# Patient Record
Sex: Female | Born: 1945 | Race: White | Hispanic: No | Marital: Married | Smoking: Never smoker
Health system: Southern US, Community
[De-identification: ages and names within clinical notes are randomized; demographics above are authoritative.]

## PROBLEM LIST (undated history)

## (undated) DIAGNOSIS — R609 Edema, unspecified: Secondary | ICD-10-CM

## (undated) DIAGNOSIS — M1990 Unspecified osteoarthritis, unspecified site: Secondary | ICD-10-CM

## (undated) DIAGNOSIS — M2550 Pain in unspecified joint: Secondary | ICD-10-CM

## (undated) DIAGNOSIS — R55 Syncope and collapse: Secondary | ICD-10-CM

## (undated) DIAGNOSIS — E0520 Thyrotoxicosis with toxic multinodular goiter without thyrotoxic crisis or storm: Secondary | ICD-10-CM

## (undated) DIAGNOSIS — F32A Depression, unspecified: Secondary | ICD-10-CM

## (undated) DIAGNOSIS — I219 Acute myocardial infarction, unspecified: Secondary | ICD-10-CM

## (undated) DIAGNOSIS — I480 Paroxysmal atrial fibrillation: Secondary | ICD-10-CM

## (undated) DIAGNOSIS — I10 Essential (primary) hypertension: Secondary | ICD-10-CM

## (undated) DIAGNOSIS — R519 Headache, unspecified: Secondary | ICD-10-CM

## (undated) DIAGNOSIS — D259 Leiomyoma of uterus, unspecified: Secondary | ICD-10-CM

## (undated) DIAGNOSIS — IMO0001 Reserved for inherently not codable concepts without codable children: Secondary | ICD-10-CM

## (undated) DIAGNOSIS — R32 Unspecified urinary incontinence: Secondary | ICD-10-CM

## (undated) DIAGNOSIS — M3390 Dermatopolymyositis, unspecified, organ involvement unspecified: Secondary | ICD-10-CM

## (undated) DIAGNOSIS — R7989 Other specified abnormal findings of blood chemistry: Secondary | ICD-10-CM

## (undated) DIAGNOSIS — I2510 Atherosclerotic heart disease of native coronary artery without angina pectoris: Secondary | ICD-10-CM

## (undated) DIAGNOSIS — E538 Deficiency of other specified B group vitamins: Secondary | ICD-10-CM

## (undated) DIAGNOSIS — I499 Cardiac arrhythmia, unspecified: Secondary | ICD-10-CM

## (undated) DIAGNOSIS — R51 Headache: Secondary | ICD-10-CM

## (undated) DIAGNOSIS — E039 Hypothyroidism, unspecified: Secondary | ICD-10-CM

## (undated) DIAGNOSIS — E119 Type 2 diabetes mellitus without complications: Secondary | ICD-10-CM

## (undated) DIAGNOSIS — M791 Myalgia, unspecified site: Secondary | ICD-10-CM

## (undated) DIAGNOSIS — E0590 Thyrotoxicosis, unspecified without thyrotoxic crisis or storm: Secondary | ICD-10-CM

## (undated) DIAGNOSIS — R600 Localized edema: Secondary | ICD-10-CM

## (undated) DIAGNOSIS — F419 Anxiety disorder, unspecified: Secondary | ICD-10-CM

## (undated) DIAGNOSIS — M3313 Other dermatomyositis without myopathy: Secondary | ICD-10-CM

## (undated) DIAGNOSIS — F329 Major depressive disorder, single episode, unspecified: Secondary | ICD-10-CM

## (undated) HISTORY — DX: Localized edema: R60.0

## (undated) HISTORY — DX: Unspecified urinary incontinence: R32

## (undated) HISTORY — DX: Morbid (severe) obesity due to excess calories: E66.01

## (undated) HISTORY — PX: ANKLE SURGERY: SHX546

## (undated) HISTORY — PX: TONSILLECTOMY: SUR1361

## (undated) HISTORY — DX: Paroxysmal atrial fibrillation: I48.0

## (undated) HISTORY — DX: Atherosclerotic heart disease of native coronary artery without angina pectoris: I25.10

## (undated) HISTORY — DX: Pain in unspecified joint: M25.50

## (undated) HISTORY — PX: KNEE SURGERY: SHX244

## (undated) HISTORY — DX: Thyrotoxicosis with toxic multinodular goiter without thyrotoxic crisis or storm: E05.20

## (undated) HISTORY — DX: Depression, unspecified: F32.A

## (undated) HISTORY — DX: Acute myocardial infarction, unspecified: I21.9

## (undated) HISTORY — DX: Thyrotoxicosis, unspecified without thyrotoxic crisis or storm: E05.90

## (undated) HISTORY — DX: Myalgia, unspecified site: M79.10

## (undated) HISTORY — DX: Other dermatomyositis without myopathy: M33.13

## (undated) HISTORY — DX: Essential (primary) hypertension: I10

## (undated) HISTORY — DX: Major depressive disorder, single episode, unspecified: F32.9

## (undated) HISTORY — DX: Leiomyoma of uterus, unspecified: D25.9

## (undated) HISTORY — DX: Dermatopolymyositis, unspecified, organ involvement unspecified: M33.90

## (undated) HISTORY — DX: Deficiency of other specified B group vitamins: E53.8

## (undated) HISTORY — PX: DILATION AND CURETTAGE OF UTERUS: SHX78

## (undated) HISTORY — DX: Other specified abnormal findings of blood chemistry: R79.89

---

## 2012-03-22 ENCOUNTER — Inpatient Hospital Stay: Payer: Self-pay | Admitting: Internal Medicine

## 2012-03-22 LAB — CBC
MCH: 26.5 pg (ref 26.0–34.0)
MCV: 82 fL (ref 80–100)
Platelet: 162 10*3/uL (ref 150–440)
RBC: 4.78 10*6/uL (ref 3.80–5.20)
RDW: 16 % — ABNORMAL HIGH (ref 11.5–14.5)
WBC: 9 10*3/uL (ref 3.6–11.0)

## 2012-03-22 LAB — URINALYSIS, COMPLETE
Leukocyte Esterase: NEGATIVE
Nitrite: NEGATIVE
Ph: 6 (ref 4.5–8.0)
Protein: NEGATIVE
RBC,UR: <1 /HPF (ref 0–5)
Specific Gravity: 1.009 (ref 1.003–1.030)
Squamous Epithelial: <1
WBC UR: 1 /HPF (ref 0–5)

## 2012-03-22 LAB — COMPREHENSIVE METABOLIC PANEL
Alkaline Phosphatase: 98 U/L (ref 50–136)
Bilirubin,Total: 1.4 mg/dL — ABNORMAL HIGH (ref 0.2–1.0)
Calcium, Total: 9.8 mg/dL (ref 8.5–10.1)
Creatinine: 0.59 mg/dL — ABNORMAL LOW (ref 0.60–1.30)
EGFR (Non-African Amer.): >60
Osmolality: 286 (ref 275–301)
Potassium: 3.4 mmol/L — ABNORMAL LOW (ref 3.5–5.1)
SGOT(AST): 21 U/L (ref 15–37)
SGPT (ALT): 19 U/L
Sodium: 141 mmol/L (ref 136–145)

## 2012-03-22 LAB — CK TOTAL AND CKMB (NOT AT ARMC)
CK, Total: 45 U/L (ref 21–215)
CK-MB: 2.1 ng/mL (ref 0.5–3.6)
CK-MB: 1.7 ng/mL (ref 0.5–3.6)

## 2012-03-22 LAB — TROPONIN I
Troponin-I: <0.02 ng/mL
Troponin-I: 0.02 ng/mL

## 2012-03-22 LAB — PRO B NATRIURETIC PEPTIDE: B-Type Natriuretic Peptide: 1081 pg/mL — ABNORMAL HIGH (ref 0–125)

## 2012-03-23 LAB — BASIC METABOLIC PANEL
Anion Gap: 9 (ref 7–16)
BUN: 16 mg/dL (ref 7–18)
Calcium, Total: 9.8 mg/dL (ref 8.5–10.1)
Chloride: 107 mmol/L (ref 98–107)
Co2: 25 mmol/L (ref 21–32)
Glucose: 233 mg/dL — ABNORMAL HIGH (ref 65–99)
Osmolality: 290 (ref 275–301)
Sodium: 141 mmol/L (ref 136–145)

## 2012-03-23 LAB — MAGNESIUM: Magnesium: 1.6 mg/dL — ABNORMAL LOW

## 2012-03-23 LAB — LIPID PANEL
Cholesterol: 110 mg/dL (ref 0–200)
HDL Cholesterol: 30 mg/dL — ABNORMAL LOW (ref 40–60)
Ldl Cholesterol, Calc: 69 mg/dL (ref 0–100)

## 2012-03-23 LAB — CBC WITH DIFFERENTIAL/PLATELET
Basophil #: 0 10*3/uL (ref 0.0–0.1)
Basophil %: 0.4 %
Eosinophil #: 0.2 10*3/uL (ref 0.0–0.7)
Eosinophil %: 1.6 %
HCT: 38.7 % (ref 35.0–47.0)
MCH: 26.6 pg (ref 26.0–34.0)
MCV: 83 fL (ref 80–100)
Monocyte #: 0.9 x10 3/mm (ref 0.2–0.9)
Monocyte %: 8 %
Neutrophil %: 70.3 %
Platelet: 173 10*3/uL (ref 150–440)
RBC: 4.68 10*6/uL (ref 3.80–5.20)
RDW: 16.1 % — ABNORMAL HIGH (ref 11.5–14.5)

## 2012-03-23 LAB — T4, FREE: Free Thyroxine: 1.52 ng/dL — ABNORMAL HIGH (ref 0.76–1.46)

## 2012-03-23 LAB — CK TOTAL AND CKMB (NOT AT ARMC)
CK, Total: 45 U/L (ref 21–215)
CK-MB: 2 ng/mL (ref 0.5–3.6)

## 2012-03-23 LAB — TROPONIN I: Troponin-I: 0.03 ng/mL

## 2012-03-23 LAB — TSH: Thyroid Stimulating Horm: 0.016 u[IU]/mL — ABNORMAL LOW

## 2012-04-24 ENCOUNTER — Emergency Department: Payer: Self-pay | Admitting: Emergency Medicine

## 2012-04-24 LAB — CBC
HGB: 13.7 g/dL (ref 12.0–16.0)
MCH: 27.1 pg (ref 26.0–34.0)
Platelet: 192 10*3/uL (ref 150–440)
RBC: 5.03 10*6/uL (ref 3.80–5.20)
WBC: 14 10*3/uL — ABNORMAL HIGH (ref 3.6–11.0)

## 2012-04-24 LAB — COMPREHENSIVE METABOLIC PANEL
Anion Gap: 7 (ref 7–16)
Bilirubin,Total: 1.5 mg/dL — ABNORMAL HIGH (ref 0.2–1.0)
Calcium, Total: 10.1 mg/dL (ref 8.5–10.1)
Chloride: 102 mmol/L (ref 98–107)
Creatinine: 0.74 mg/dL (ref 0.60–1.30)
EGFR (Non-African Amer.): >60
Glucose: 234 mg/dL — ABNORMAL HIGH (ref 65–99)
Osmolality: 281 (ref 275–301)
Potassium: 3.4 mmol/L — ABNORMAL LOW (ref 3.5–5.1)
SGOT(AST): 16 U/L (ref 15–37)
SGPT (ALT): 18 U/L
Sodium: 137 mmol/L (ref 136–145)
Total Protein: 7.6 g/dL (ref 6.4–8.2)

## 2012-04-24 LAB — SEDIMENTATION RATE: Erythrocyte Sed Rate: 5 mm/hr (ref 0–30)

## 2012-04-24 LAB — PROTIME-INR: Prothrombin Time: 15.5 secs — ABNORMAL HIGH (ref 11.5–14.7)

## 2012-05-22 ENCOUNTER — Inpatient Hospital Stay: Payer: Self-pay | Admitting: Internal Medicine

## 2012-05-22 LAB — PROTIME-INR
INR: 1.2
Prothrombin Time: 15.4 secs — ABNORMAL HIGH (ref 11.5–14.7)

## 2012-05-22 LAB — URINALYSIS, COMPLETE
Bacteria: NONE SEEN
Bilirubin,UR: NEGATIVE
Glucose,UR: NEGATIVE mg/dL (ref 0–75)
Leukocyte Esterase: NEGATIVE
Nitrite: NEGATIVE
Ph: 8 (ref 4.5–8.0)
Protein: NEGATIVE
RBC,UR: 1 /HPF (ref 0–5)
Specific Gravity: 1.004 (ref 1.003–1.030)
Squamous Epithelial: 1
WBC UR: <1 /HPF (ref 0–5)

## 2012-05-22 LAB — COMPREHENSIVE METABOLIC PANEL
Alkaline Phosphatase: 114 U/L (ref 50–136)
Anion Gap: 9 (ref 7–16)
BUN: 8 mg/dL (ref 7–18)
Bilirubin,Total: 0.9 mg/dL (ref 0.2–1.0)
Chloride: 106 mmol/L (ref 98–107)
Co2: 26 mmol/L (ref 21–32)
EGFR (Non-African Amer.): >60
Glucose: 132 mg/dL — ABNORMAL HIGH (ref 65–99)
Osmolality: 281 (ref 275–301)
Potassium: 3.5 mmol/L (ref 3.5–5.1)
SGOT(AST): 18 U/L (ref 15–37)
SGPT (ALT): 17 U/L
Total Protein: 6.7 g/dL (ref 6.4–8.2)

## 2012-05-22 LAB — TROPONIN I
Troponin-I: <0.02 ng/mL
Troponin-I: 1.2 ng/mL — ABNORMAL HIGH

## 2012-05-22 LAB — CBC
MCH: 27.2 pg (ref 26.0–34.0)
MCV: 83 fL (ref 80–100)
Platelet: 195 10*3/uL (ref 150–440)
RBC: 5.11 10*6/uL (ref 3.80–5.20)

## 2012-05-22 LAB — CK TOTAL AND CKMB (NOT AT ARMC)
CK, Total: 78 U/L (ref 21–215)
CK-MB: 0.9 ng/mL (ref 0.5–3.6)

## 2012-05-22 LAB — TSH: Thyroid Stimulating Horm: 1.81 u[IU]/mL

## 2012-05-23 LAB — BASIC METABOLIC PANEL
BUN: 16 mg/dL (ref 7–18)
Chloride: 105 mmol/L (ref 98–107)
Creatinine: 0.83 mg/dL (ref 0.60–1.30)
EGFR (African American): >60
EGFR (Non-African Amer.): >60
Glucose: 209 mg/dL — ABNORMAL HIGH (ref 65–99)
Osmolality: 289 (ref 275–301)
Potassium: 3.7 mmol/L (ref 3.5–5.1)

## 2012-05-23 LAB — CBC WITH DIFFERENTIAL/PLATELET
Basophil #: 0.1 10*3/uL (ref 0.0–0.1)
Basophil %: 0.6 %
Eosinophil #: 0.1 10*3/uL (ref 0.0–0.7)
Eosinophil %: 0.5 %
HCT: 44.8 % (ref 35.0–47.0)
HGB: 14.5 g/dL (ref 12.0–16.0)
Lymphocyte #: 3.2 10*3/uL (ref 1.0–3.6)
Lymphocyte %: 21.5 %
MCH: 26.9 pg (ref 26.0–34.0)
Neutrophil #: 10.2 10*3/uL — ABNORMAL HIGH (ref 1.4–6.5)
Neutrophil %: 69.1 %
Platelet: 246 10*3/uL (ref 150–440)

## 2012-05-23 LAB — CK TOTAL AND CKMB (NOT AT ARMC)
CK, Total: 126 U/L (ref 21–215)
CK-MB: 17.7 ng/mL — ABNORMAL HIGH (ref 0.5–3.6)

## 2013-01-29 IMAGING — US US EXTREM LOW VENOUS BILAT
1 series · 17 of 24 positions shown · non-contrast
Comparison: none

REASON FOR EXAM: swelling, pain
COMMENTS:

[Series 1: us extrem low venous bilat · 17 of 49 slices shown]
[im 1/49]
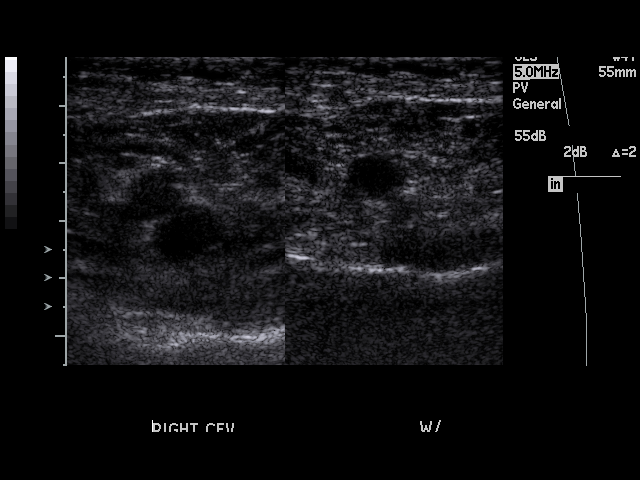
[im 5/49]
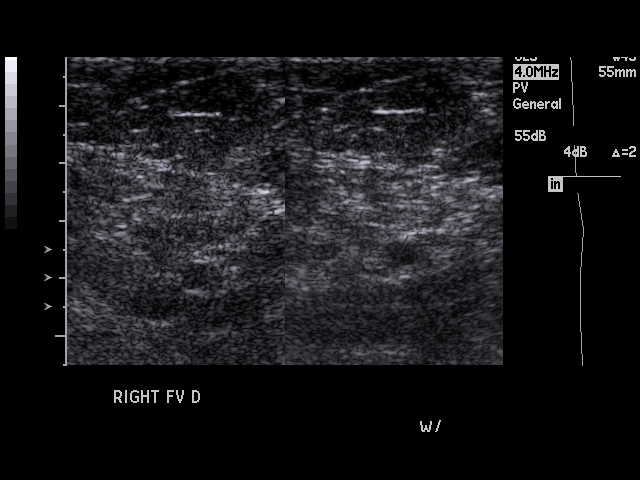
[im 7/49]
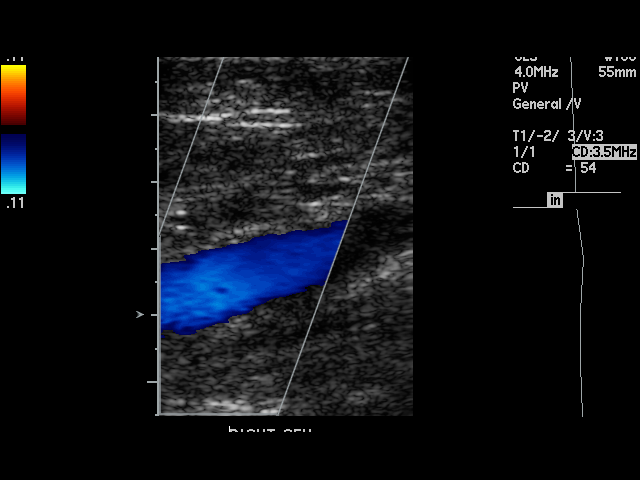
[im 9/49]
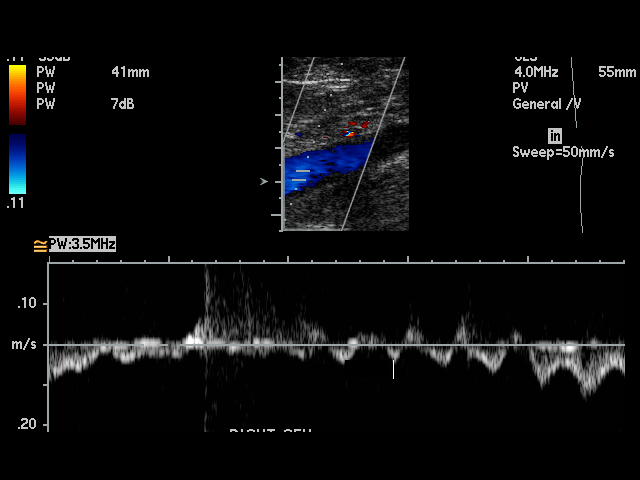
[im 13/49]
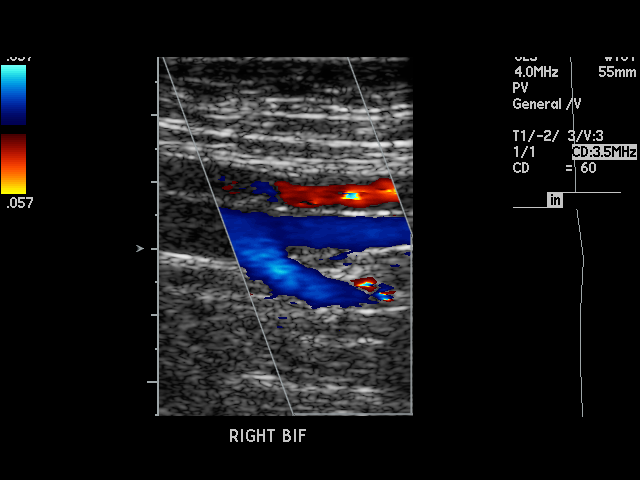
[im 15/49]
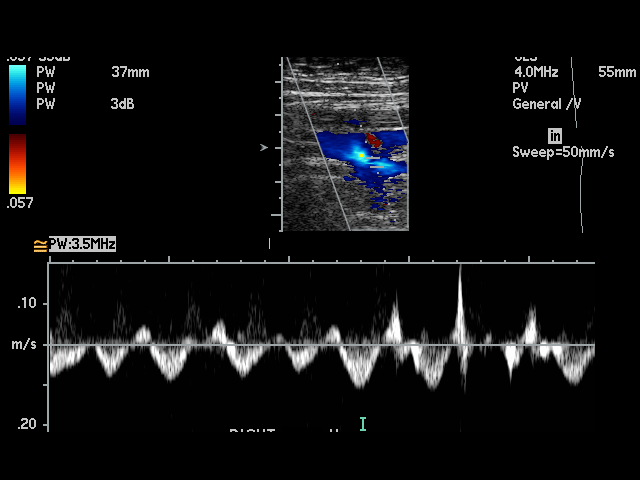
[im 19/49]
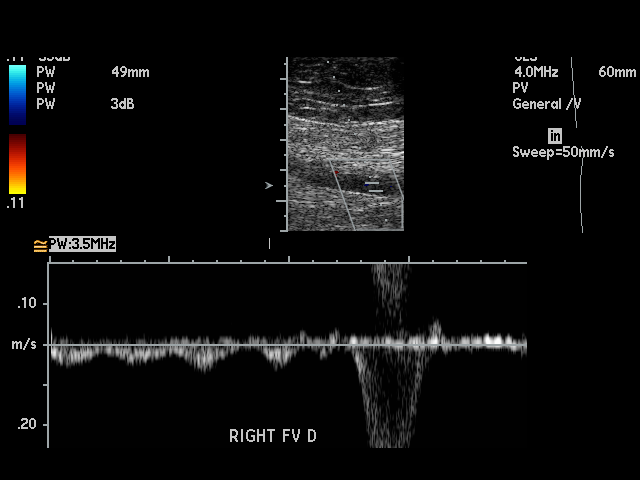
[im 21/49]
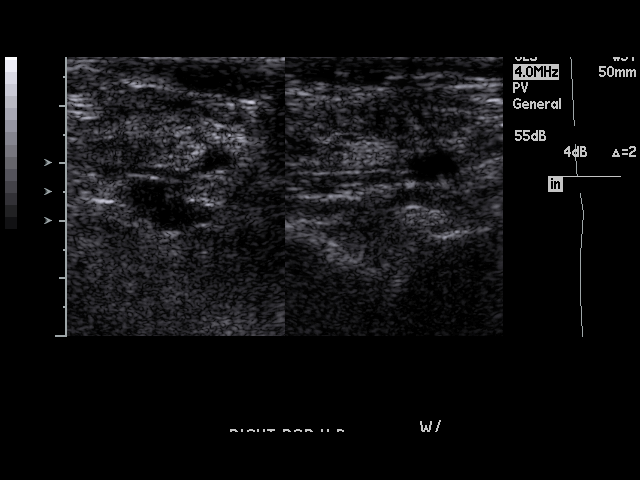
[im 26/49]
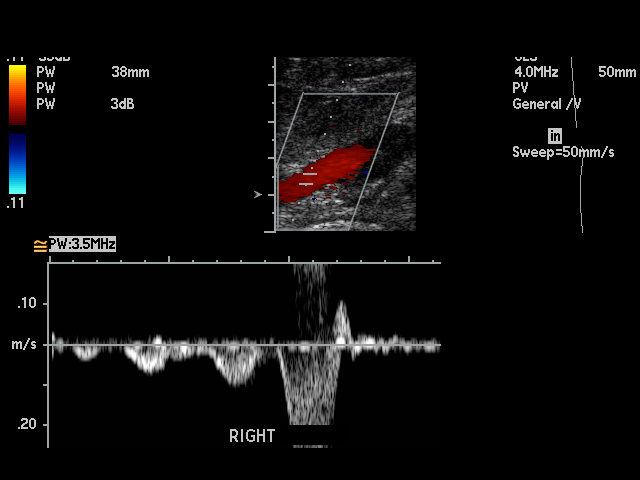
[im 28/49]
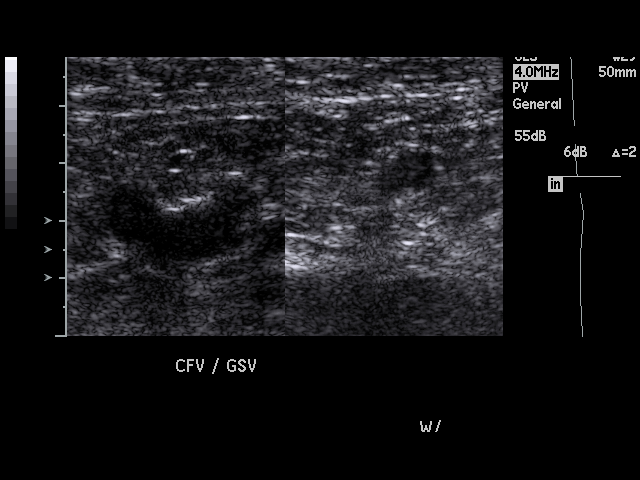
[im 30/49]
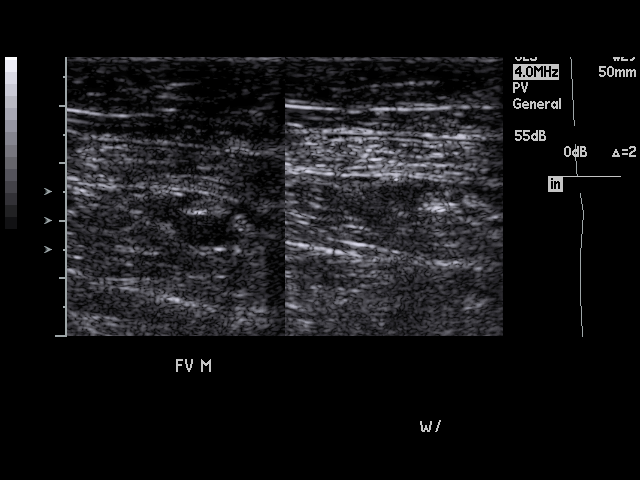
[im 34/49]
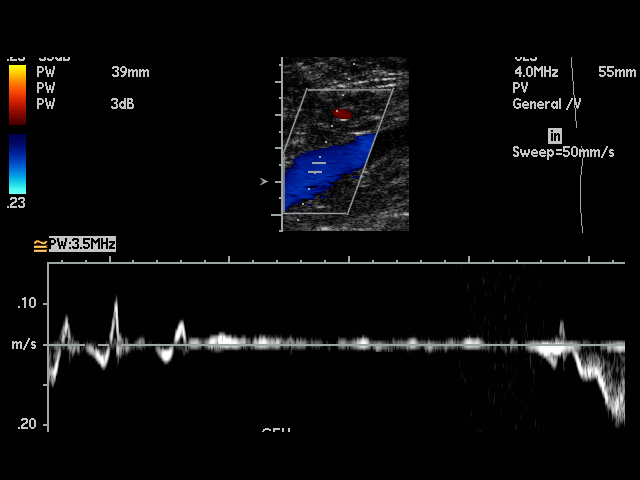
[im 36/49]
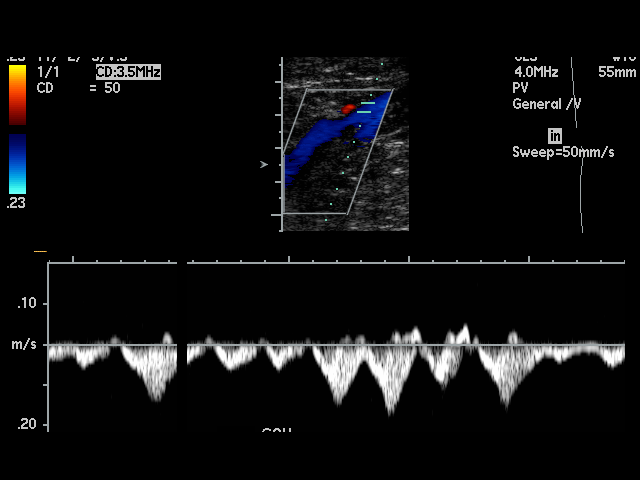
[im 40/49]
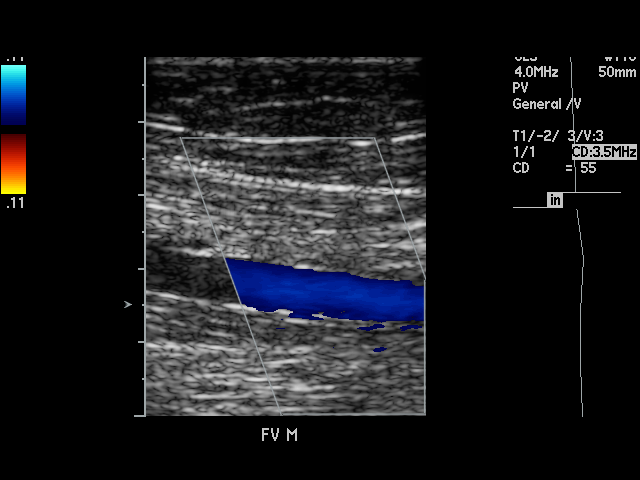
[im 42/49]
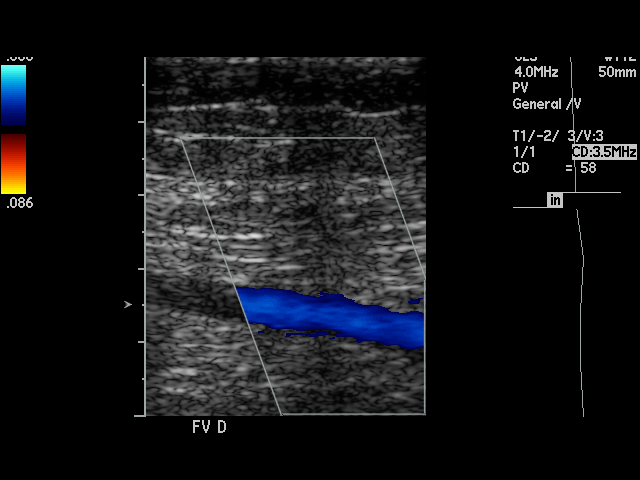
[im 44/49]
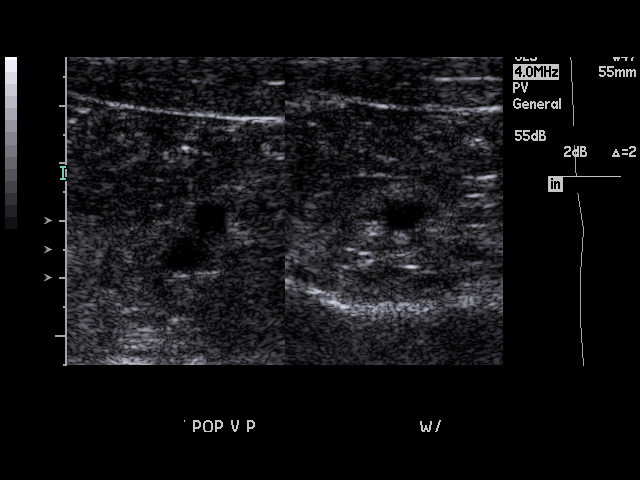
[im 49/49]
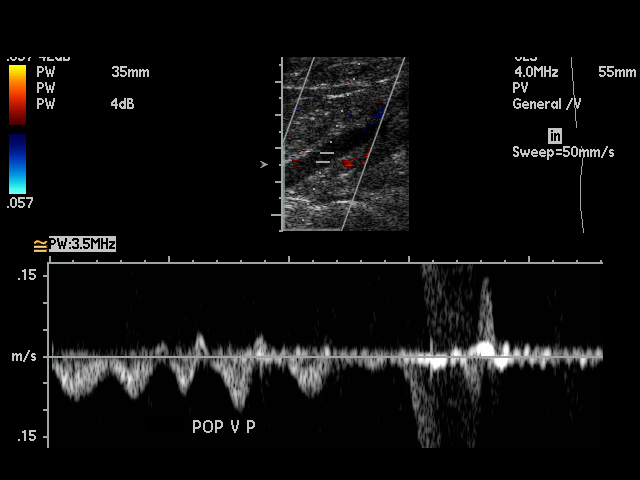

[17 of 24 positions shown; findings below may reference images not displayed]

PROCEDURE:     US  - US DOPPLER LOW EXTR BILATERAL  - March 22, 2012  [DATE]

RESULT:     The phasic, augmentation and Valsalva flow waveforms are normal
in appearance bilaterally. The femoral and popliteal veins bilaterally show
complete compressibility throughout their course. Bilateral Doppler
examination shows no occlusion or evidence for deep vein thrombosis.
IMPRESSION: 1.     Bilaterally normal study. No deep vein thrombosis is identified on
either side.

## 2013-03-03 IMAGING — CR DG HAND COMPLETE 3+V*L*
1 series · 3 of 3 positions shown · non-contrast
Comparison: none

REASON FOR EXAM: atraumatic pain and swelling
COMMENTS:

PROCEDURE:     DXR - DXR HAND LT COMPLETE  W/OBLIQUES  - April 24, 2012 [DATE]
RESULT:     Left hand images demonstrate interphalangeal degenerative
narrowing especially in the second distal interphalangeal joint. There is no
fracture, dislocation or foreign body.

[Series 1: pa · 0.17mm/px · 3 of 3 slices shown]
[im 1/3]
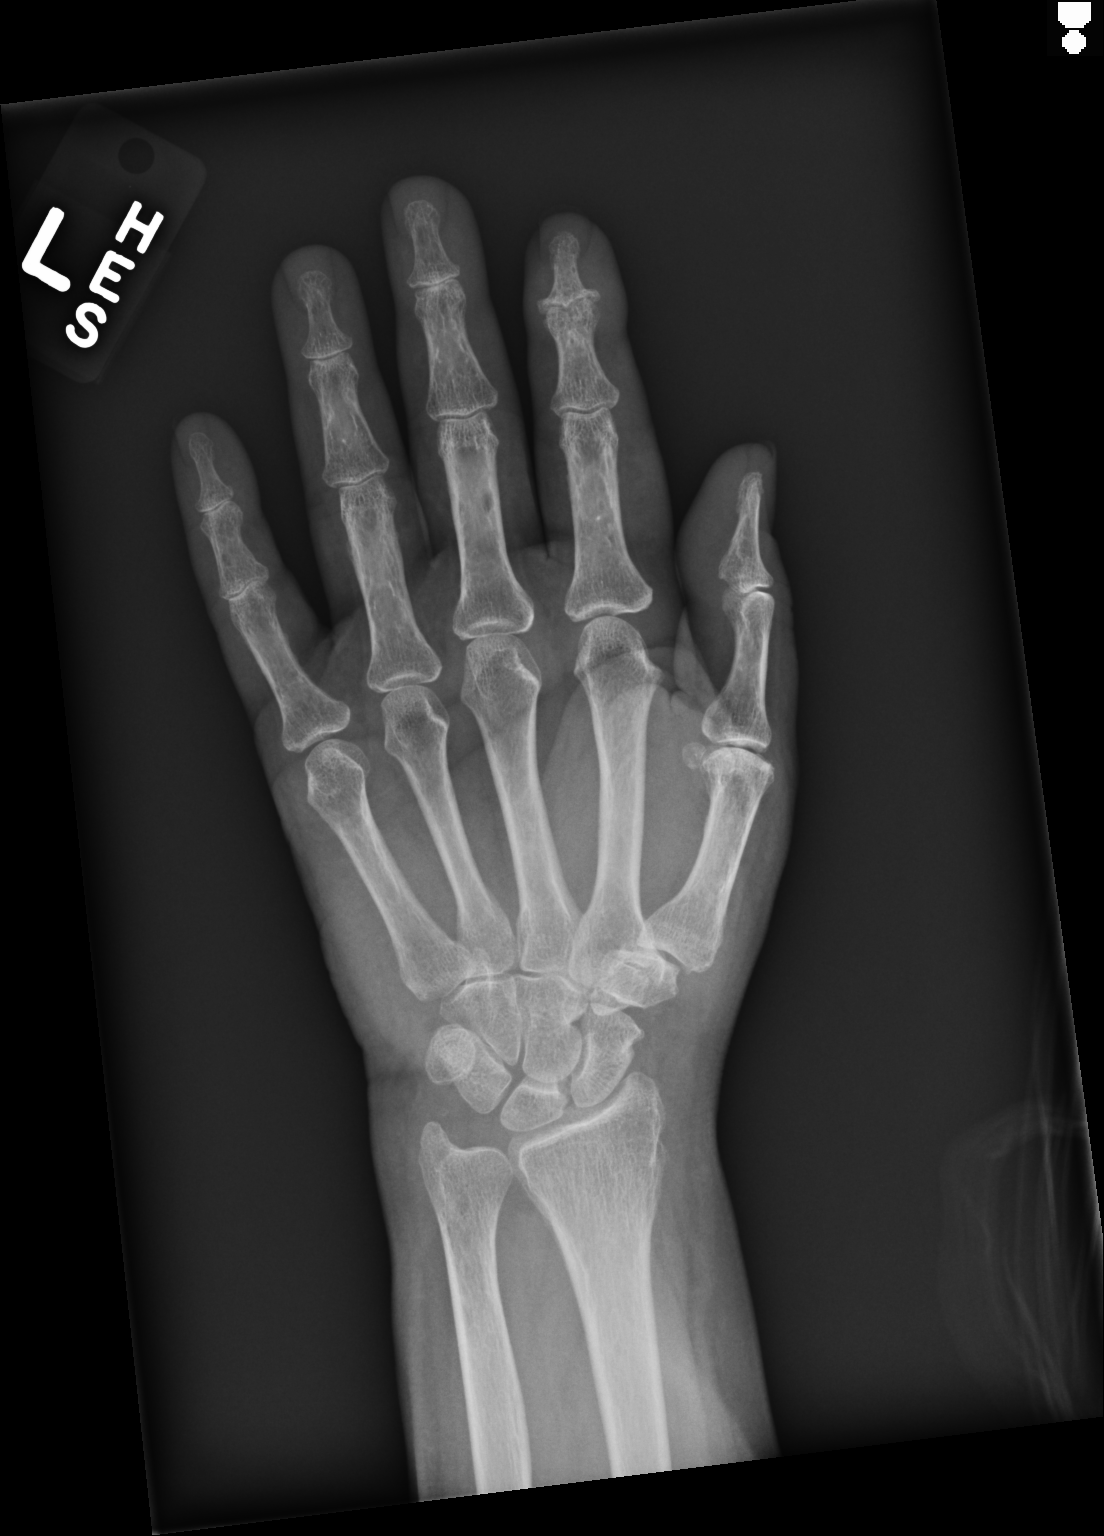
[im 2/3]
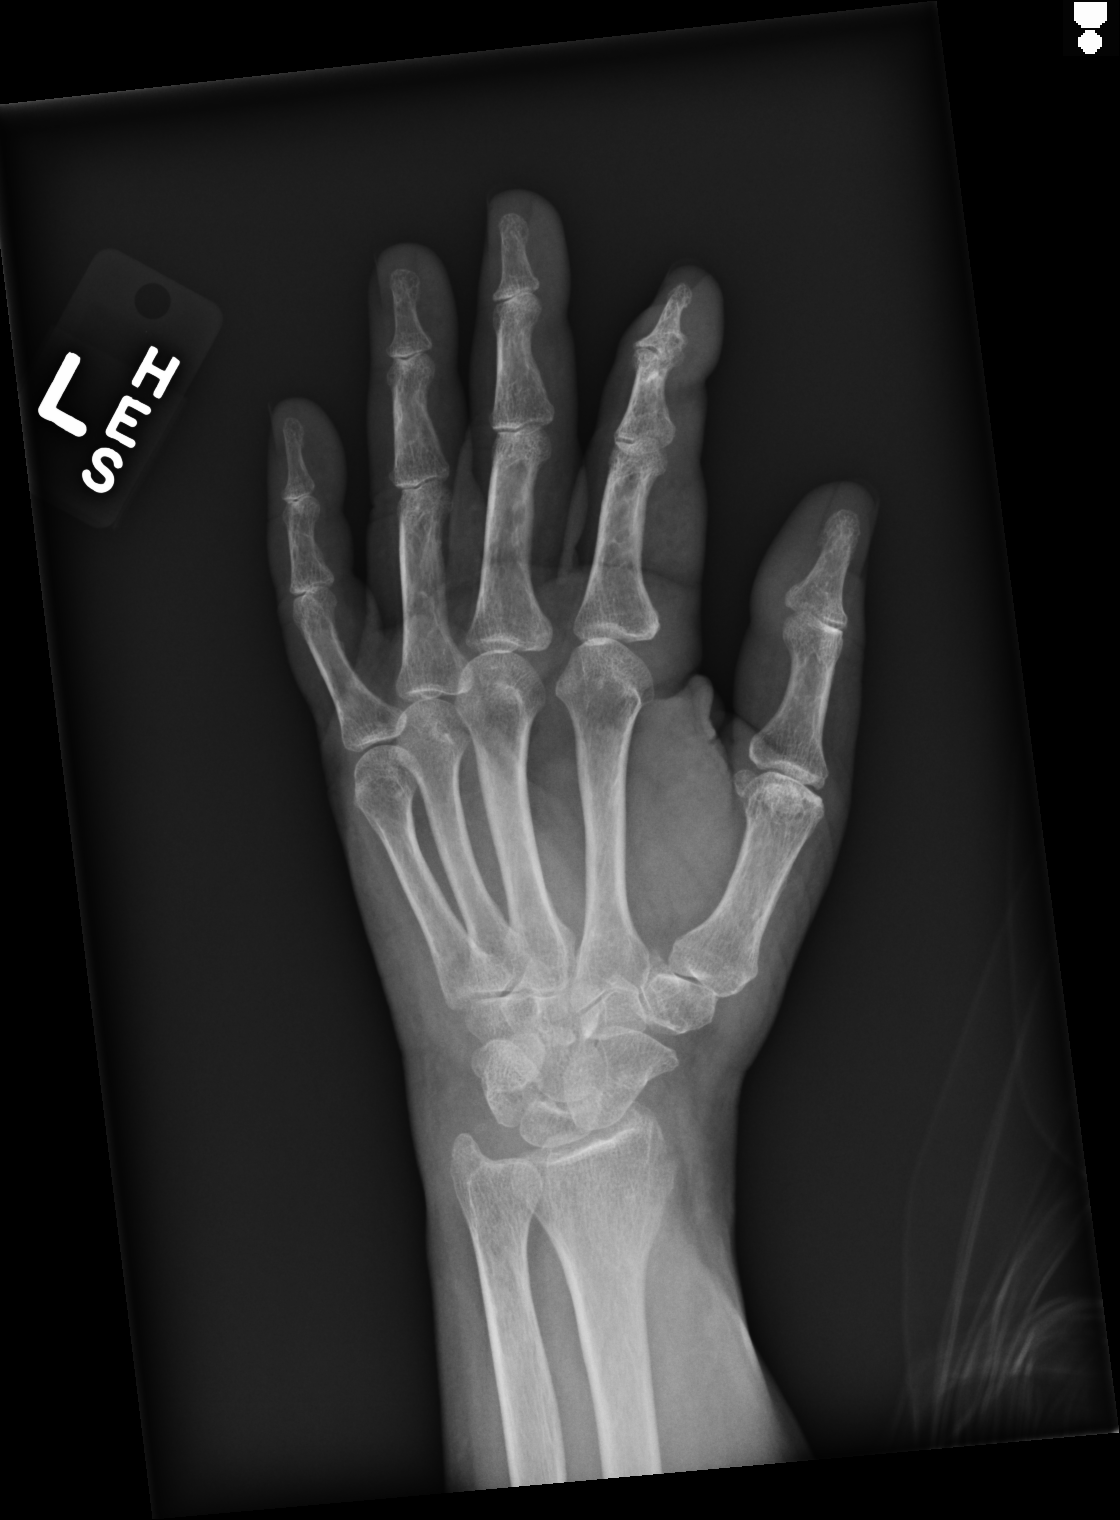
[im 3/3]
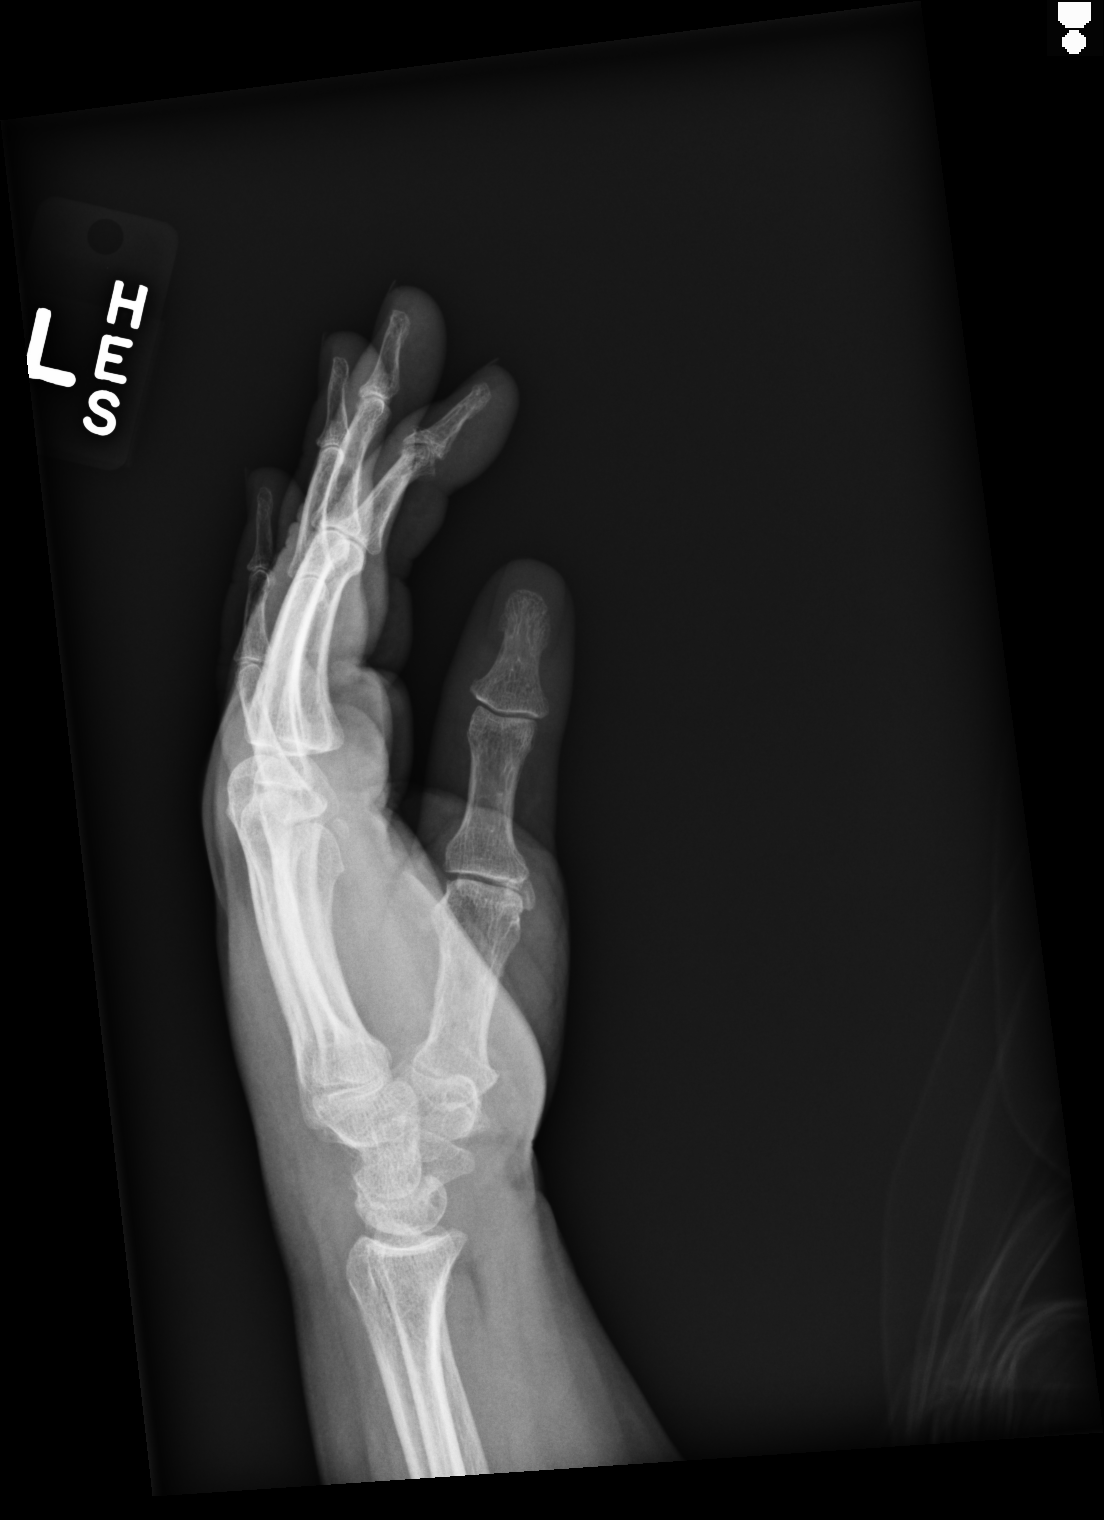

[3 of 3 positions shown; findings below may reference images not displayed]

IMPRESSION: 1. No findings to suggest fracture or dislocation.

[REDACTED]

## 2013-03-31 IMAGING — CT CT HEAD WITHOUT CONTRAST
2 series · 15 of 30 positions shown, 19 images · non-contrast
Comparison: none

REASON FOR EXAM: CVA
COMMENTS:   May transport without cardiac monitor

PROCEDURE:     CT  - CT HEAD WITHOUT CONTRAST  - May 22, 2012 [DATE]
RESULT:     Comparison:  None
TECHNIQUE: Multiple axial images from the foramen magnum to the vertex were
obtained without IV contrast.

[Series 2: without · axial · non-contrast · 0.44mm/px · z∈[+442,+568]mm · 13 of 31 slices shown, 17 images]
[im 3/31  brain]
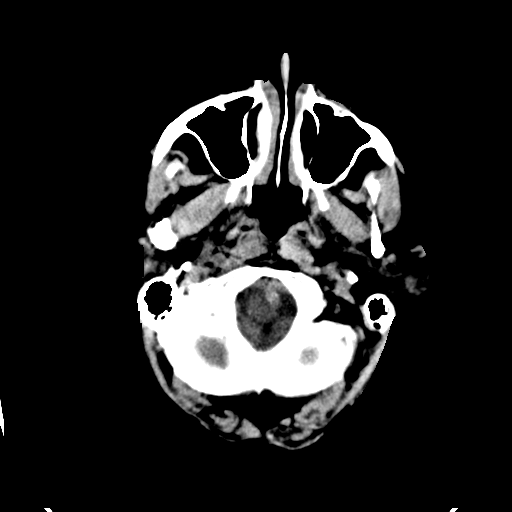
[im 3/31  bone]
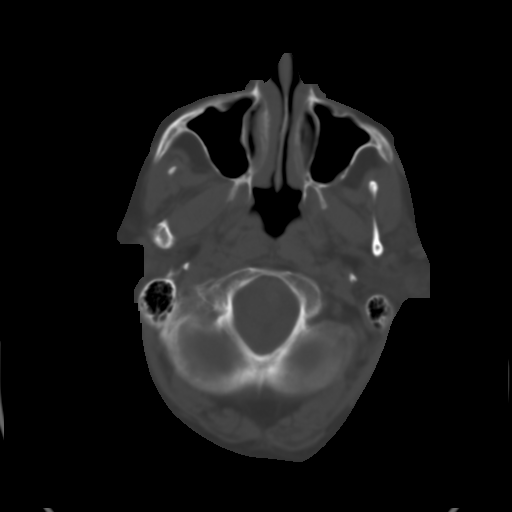
[im 5/31  brain]
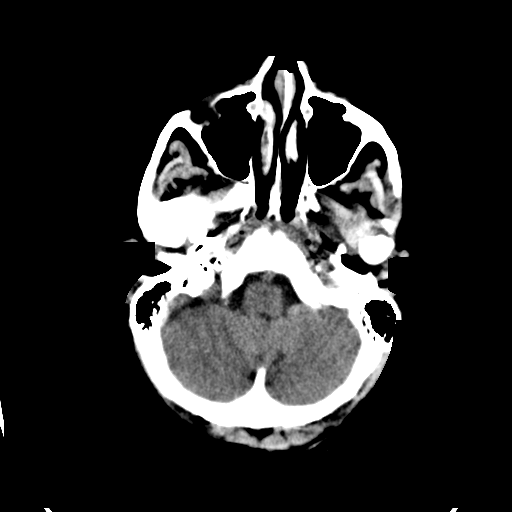
[im 7/31  brain]
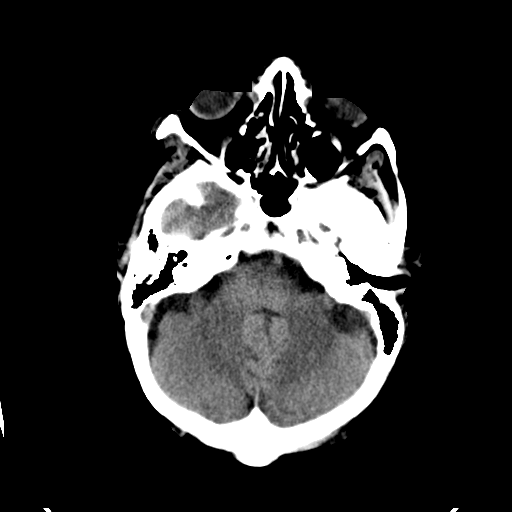
[im 9/31  brain]
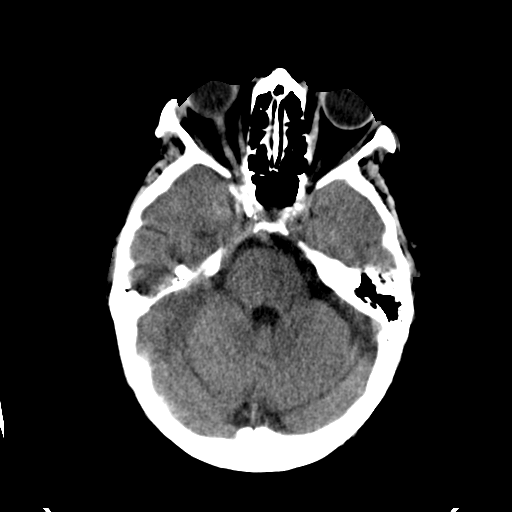
[im 11/31  brain]
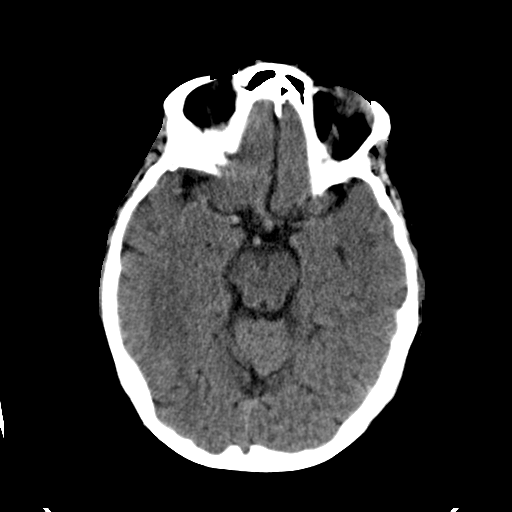
[im 11/31  bone]
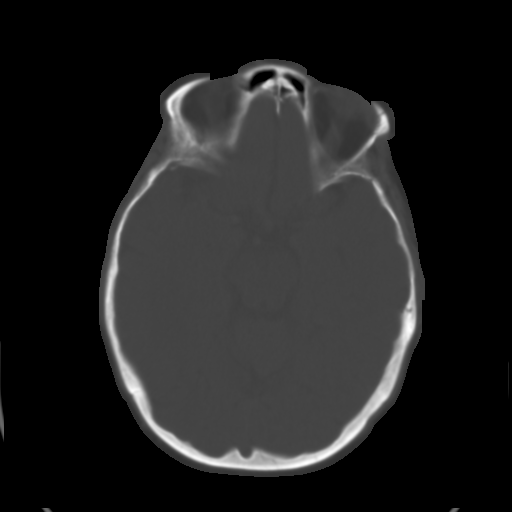
[im 13/31  brain]
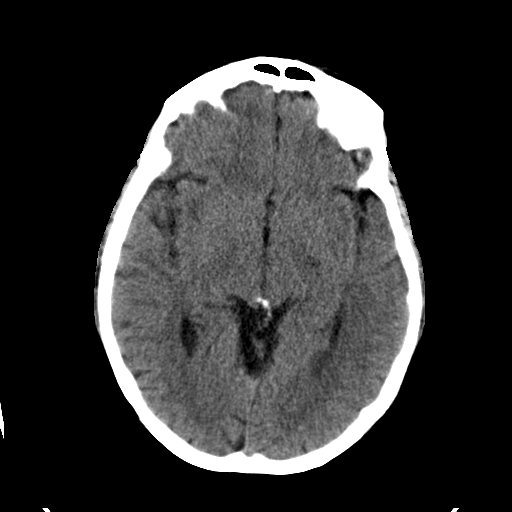
[im 16/31  brain]
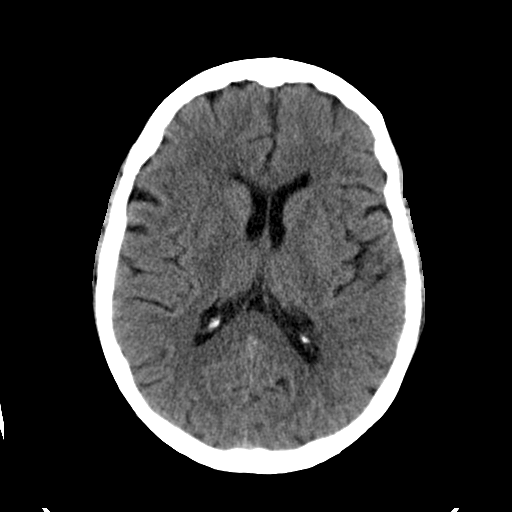
[im 18/31  brain]
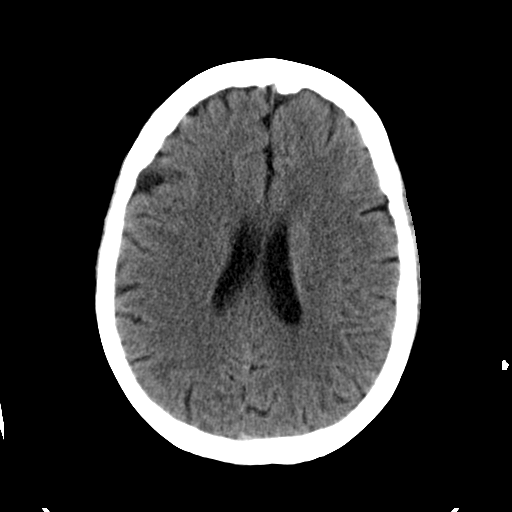
[im 20/31  brain]
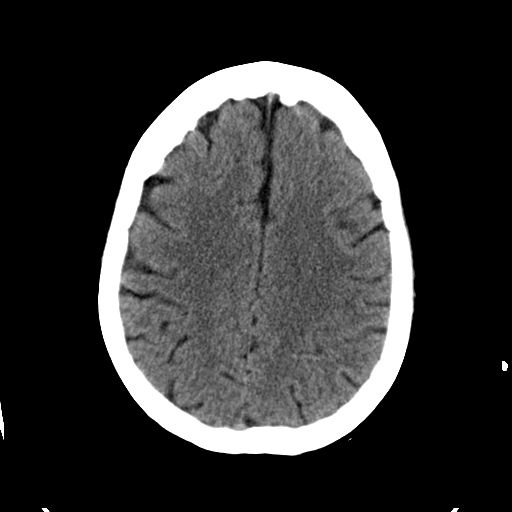
[im 20/31  bone]
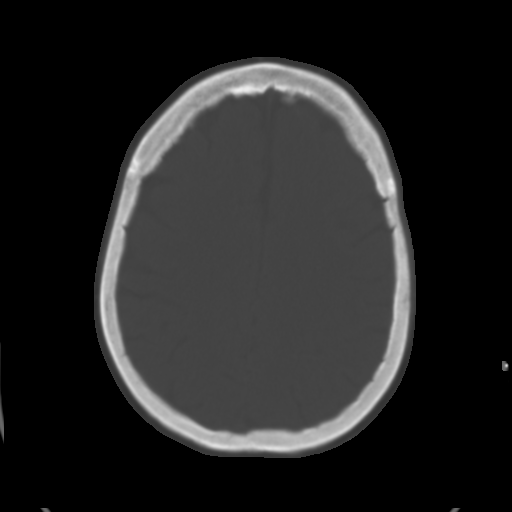
[im 22/31  brain]
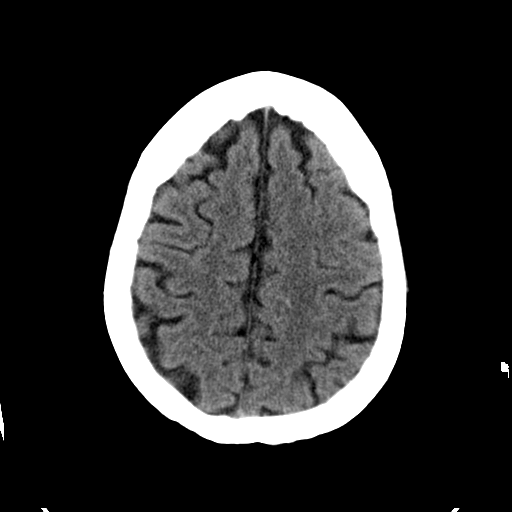
[im 24/31  brain]
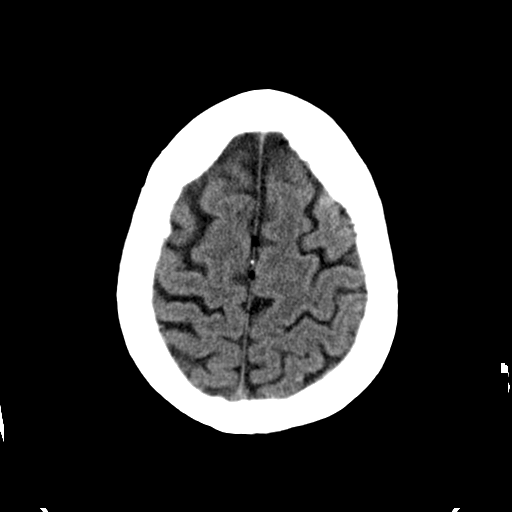
[im 26/31  brain]
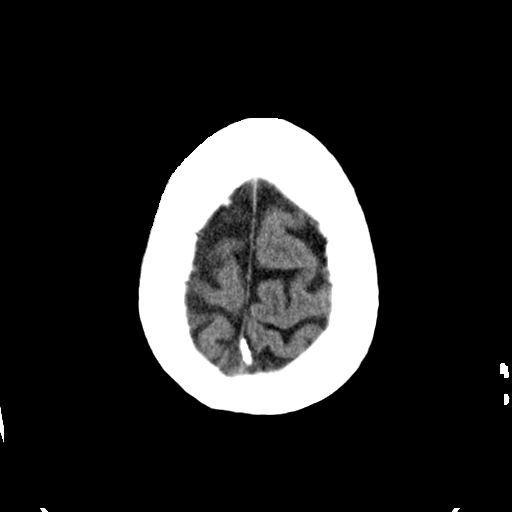
[im 28/31  brain]
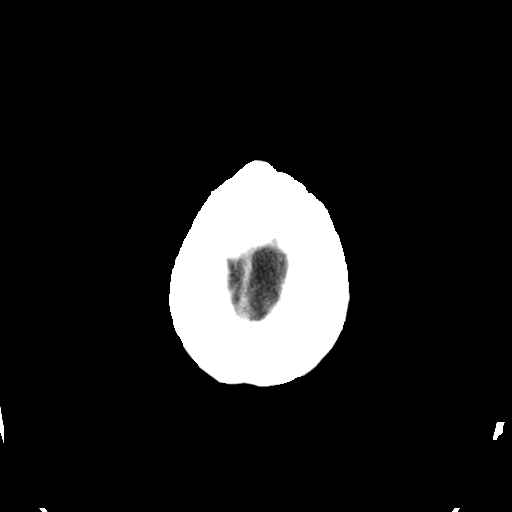
[im 28/31  bone]
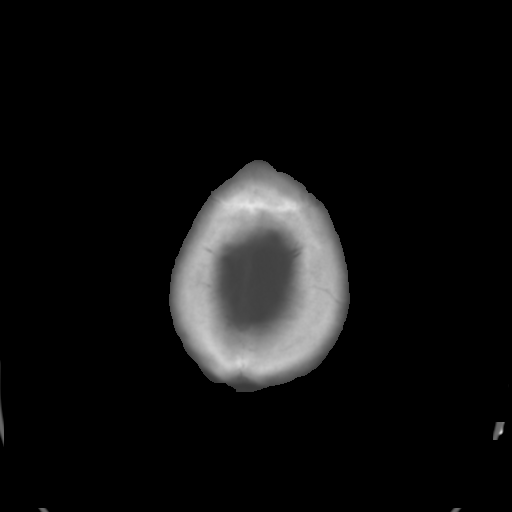

[Series 3: bone · axial · 0.44mm/px · z∈[+442,+462]mm · 2 of 31 slices shown]
[im 3/31  bone]
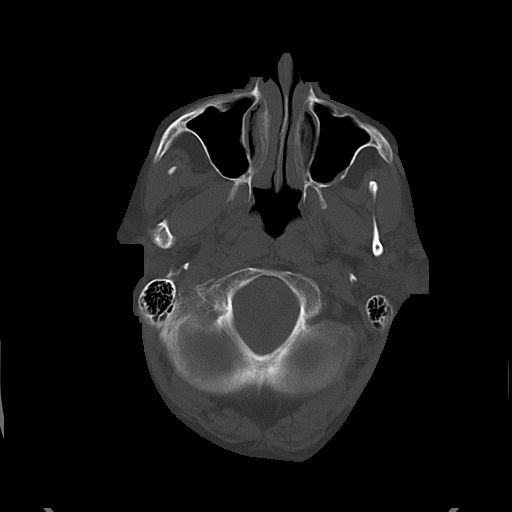
[im 7/31  bone]
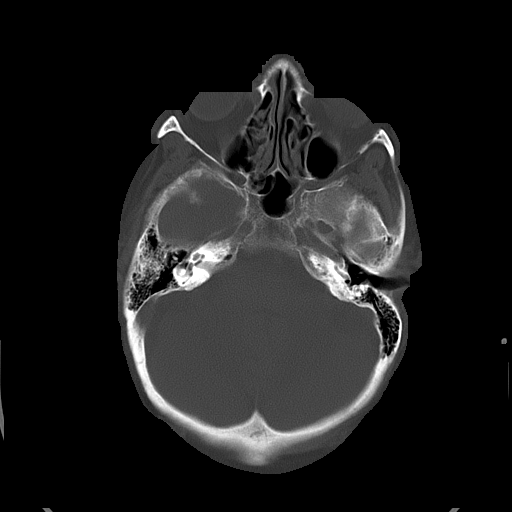

[15 of 30 positions shown; findings below may reference images not displayed]

FINDINGS: There is no evidence of mass effect, midline shift, or extra-axial fluid
collections.  There is no evidence of a space-occupying lesion or
intracranial hemorrhage. There is no evidence of a cortical-based area of
acute infarction.  There is a cystic mass in the region of the pineal gland
likely representing a pineal cyst.

The ventricles and sulci are appropriate for the patient's age. The basal
cisterns are patent.

Visualized portions of the orbits are unremarkable. The visualized portions
of the paranasal sinuses and mastoid air cells are unremarkable.

The osseous structures are unremarkable.
IMPRESSION: No acute intracranial process.

There is a cystic mass in the region of the pineal gland likely representing
a pineal cyst. If there is further clinical concern recommend evaluation
with MRI.

[REDACTED]

## 2015-04-07 NOTE — H&P (Signed)
PATIENT NAME:  Caitlin Joseph, Caitlin Joseph MR#:  [id number] DATE OF BIRTH:  04/28/1946  DATE OF ADMISSION:  05/22/2012  PRIMARY CARE PHYSICIAN: Dr. Ginette Pitman    CHIEF COMPLAINT: Left facial numbness and left facial droop today.   HISTORY OF PRESENT ILLNESS: Caitlin Joseph is a 69 year old Caucasian female who was recently admitted in April and discharged with atrial fibrillation on Xarelto. She had history of congestive heart failure at that time. She comes in today with complaints of left facial droop, numbness, and difficulty with hearing in the left ear since the day before yesterday. Her weakness more so started today where she feels numb on the left face with saliva drooling from the left angle of the mouth along with difficulty closing her left eye. She was found to be in malignant hypertension in the Emergency Room on admission. On arrival her blood pressure was 248/112. At that time her heart rate was in the 70's. The patient was then diagnosed to have a left facial palsy. She was initially thinking she had a stroke. She became quite anxious and later on she went into rapid atrial fibrillation with heart rate from 120 to 140's. IV Cardizem 10 mg was given. The patient and the patient's husband report the patient is unable to tolerate IV Cardizem. She is on oral diltiazem at home which she is able to tolerate along with metoprolol. At present the patient and her husband do not want any further IV medication and they want her to "relax" a few minutes before starting any other new medicine. She is being admitted for further evaluation and management. Her CT of the head was negative for any acute intracranial process. 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 MRI recommended.   PAST MEDICAL HISTORY: 1. Atrial fibrillation, on anticoagulation with Xarelto.  2. Osteoarthritis.  3. Neck pain, back pain.  4. Hypertension.  5. Morbid obesity.  6. History of diabetes for which  she is on insulin, Humulin N 30 to 35 units 3 to 4 times a day with meals and she is also on Humulin sliding scale.   MEDICATIONS: 1. Humulin N 30 units 3 to 4 times a day with meals. 2. Humulin R 10 to 12 units 3 to 4 times a day. She has been taking at home.  3. Methimazole 20 mg p.o. daily.  4. Metoprolol 50 mg b.i.d.  5. Hydrochlorothiazide 12.5 p.o. daily.  6. Klor-Con 20 mEq p.o. b.i.d.  7. Vicodin 5/500 one tablet b.i.d.  8. Xarelto 20 mg p.o. daily.   SOCIAL HISTORY: She lives at home with her husband. Nonsmoker, nonalcoholic.   ALLERGIES: Amlodipine, bisoprolol, carvedilol, enalapril, and penicillin.   PAST SURGICAL HISTORY:  1. Multiple fractures including left ankle and knee surgery.  2. D and C.   FAMILY HISTORY: Mother died at age 40. Grandmother had diabetes. Father died of metastatic pancreatic cancer.   REVIEW OF SYSTEMS: CONSTITUTIONAL: No fever, fatigue, or weakness. EYES: Mild blurred vision. ENT: The patient has left facial droop with drooling of saliva from the left angle of the mouth along with decreased hearing on the left side. RESPIRATORY: No cough or wheezing. CARDIOVASCULAR: Positive for atrial fibrillation and hypertension. GI: No nausea, vomiting, diarrhea, or abdominal pain. GU: No dysuria or hematuria. ENDOCRINE: No polyuria or nocturia. HEMATOLOGY: No anemia or easy bruising. SKIN: No acne or rash. MUSCULOSKELETAL: Positive for arthritis, back pain. NEUROLOGIC: No CVA, TIA. The patient does have left facial  droop. All other systems reviewed and negative.   PHYSICAL EXAMINATION:   GENERAL: The patient is awake. She is alert. She is oriented x3. She is very anxious.   VITAL SIGNS: She is afebrile, pulse is in the 130's to 140's, atrial fibrillation, respirations 26, blood pressure 137/99, sats are 97% on 2 liters.   HEENT: Atraumatic, normocephalic. Pupils equal, round, and reactive to light and accommodation. Extraocular movements intact. Oral mucosa is  dry. The patient does have left facial droop with sagging of the left eyelid. She does have some decreased numbness on the left side of the face.   NECK: Supple. No JVD. No carotid bruit.   RESPIRATORY: Clear to auscultation bilaterally. No rales, rhonchi, respiratory distress, or labored breathing.   CARDIOVASCULAR: Atrial fibrillation, rapid onset. No murmur heard. PMI not lateralized. Chest nontender.   EXTREMITIES: Good pedal pulses. Good femoral pulses. No lower extremity edema.   ABDOMEN: Soft, benign, nontender. No organomegaly. Positive bowel sounds.   NEUROLOGIC: The patient does have left facial palsy. No other neuro deficit. Her reflexes are 1+ in both upper and lower extremities. Motor strength 4+ in both upper and lower extremities.   SKIN: Warm and dry.   LABORATORY, DIAGNOSTIC, AND RADIOLOGICAL DATA: Urinalysis negative for urinary tract infection. Chest x-ray is no acute disease of the chest. CT of the head without contrast shows no acute intracranial process. 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 CBC within normal limits. Basic metabolic panel within normal limits. Troponin is 0.02. ESR is 7. EKG shows rapid atrial fibrillation.   ASSESSMENT AND PLAN: 69 year old Caitlin Joseph with:  1. Rapid atrial fibrillation. More so the patient is anxious at present. She does have history of chronic atrial fibrillation on anticoagulation with Xarelto. Will admit patient to Intensive Care Unit. Will consider IV Lopressor 5 mg x3 doses five minutes apart if blood pressure allows [place]. The patient and her husband do not want anymore IV Cardizem since "they think it's giving her side effects with back pain, visual loss, and making her feel like a rock". Cycle cardiac enzymes x3. Cardiology consultation. I did consult Dr. Clayborn Bigness who was on call for [facility].  Will try to continue oral diltiazem and metoprolol for  now.  2. Left-sided Bell's palsy. The patient presented with ear pain, facial drooping, unable to close her left eyelid without any other focal weakness suggestive of Bell's palsy. The patient will be started on prednisone 60 mg daily for five days and then taper 10 mg daily to complete a 10-day course along with empiric acyclovir 400 mg 5 times a day.  3. Back pain and hand pain, appears to be arthritis. Continue Percocet p.r.n.  4. Malignant hypertension on arrival, blood pressure now stable. Initially was in the 200's. 2 gram sodium diet. Will continue IV fluids for hydration.  5. Further work-up according to the patient's clinical course.   Hospital admission plan was discussed with the patient and the patient's husband at length. Case was also discussed with Dr. Clayborn Bigness.   CRITICAL TIME SPENT: 55 minutes.   ____________________________ Hart Rochester Posey Pronto, MD sap:drc D: 05/22/2012 16:31:11 ET T: 05/23/2012 06:46:54 ET JOB#: [id number]  cc: Maleia Weems A. Posey Pronto, MD, <Dictator> Tracie Harrier, MD Ilda Basset MD ELECTRONICALLY SIGNED 06/04/2012 15:42

## 2015-04-07 NOTE — Consult Note (Signed)
PATIENT NAME:  Caitlin Joseph, Caitlin Joseph MR#:  [id number] DATE OF BIRTH:  [birth year]  DATE OF CONSULTATION:  03/23/2012  REFERRING PHYSICIAN:  Dr. Manuella Ghazi CONSULTING PHYSICIAN:  Corey Skains, MD  REASON FOR CONSULTATION: Congestive heart failure, hypertension, diabetes mellitus, sleep apnea, and atrial fibrillation with rapid ventricular rate.   HISTORY OF PRESENT ILLNESS: The patient is a 69 year old female with known hypertension, diabetes mellitus, who has had new onset of significant shortness of breath over the last several weeks. This shortness of breath is progressive over a few weeks with some lower extremity edema. The patient does have some congestive heart failure symptoms and a little bit of pulmonary edema by chest x-ray. Upon arrival to the Emergency Room, the patient did have an EKG showing atrial fibrillation with rapid ventricular rate and nonspecific ST changes consistent with above. The patient also has signs and symptoms of possible sleep apnea. Her diabetes mellitus and hypertension previously had been appropriately managed with current medical regimen, but now she has more hypertension due to the concerns and shortness of breath that she has had. The patient now is slightly improved. Her troponin, CK-MB are within normal limits.   REVIEW OF SYSTEMS: The remainder of review of systems is negative for vision change, ringing in the ears, hearing loss, cough, congestion, heartburn, nausea, vomiting, diarrhea, bloody stools, stomach pain, extremity pain, leg weakness, cramping of the buttocks, known blood clots, headaches, blackouts, dizzy spells, nosebleed, congestion, trouble swallowing, frequent urination, urination at night, muscle weakness, numbness, anxiety, depression, skin lesions, or skin rashes.   PAST MEDICAL HISTORY:  1. Hypertension.  2. Hyperlipidemia.  3. Congestive heart failure.  4. Edema.  5. Sleep apnea.   FAMILY HISTORY: No family members with early onset of cardiovascular  disease.   SOCIAL HISTORY: Remote tobacco use. Currently denies alcohol use.  ALLERGIES: No known drug allergies.   CURRENT MEDICATIONS: As listed.   PHYSICAL EXAMINATION:  VITAL SIGNS: Blood pressure is 136/68 bilaterally. Heart rate is 110 upright, reclining, and irregular.   GENERAL: She is a well-appearing elderly female in no acute distress.    HEENT: No icterus, thyromegaly, ulcers, hemorrhage, or xanthelasma.   HEART: Irregularly irregular with normal S1, S2 distant sounds with 2 out of 6 apical murmur consistent with mitral regurgitation. Point of maximal impulse is normal size and placement. Carotid upstroke is normal without bruit. Jugular venous pressure is normal.   LUNGS: Lungs have bibasilar crackles with some wheezes.   ABDOMEN: Soft, nontender. Cannot assess hepatosplenomegaly due to increased abdominal girth.   EXTREMITIES: 2+ bilateral pulses in dorsal, pedal, radial, and femoral arteries with 1+ lower extremity edema. No cyanosis, clubbing, or ulcers.   NEUROLOGIC: She is oriented to time, place, and person with normal mood and affect.   ASSESSMENT: A 69 year old female with hypertension, diabetes mellitus, likely sleep apnea with congestive heart failure-type symptoms, atrial fibrillation with rapid ventricular rate without evidence of myocardial infarction.   RECOMMENDATIONS:  1. Intravenous Lasix for lower extremity edema and congestive heart failure. 2. Control of atrial fibrillation, metoprolol increasing dose as necessary for heart rate below 110 beats per minute with possible spontaneous conversion to normal rhythm.  3. Echocardiogram for LV dysfunction, valvular heart disease.  4. Assessment of congestive heart failure and reasons for atrial fibrillation with adjustments of medications.  5. The patient is to have overnight oximetry to assess for extent of sleep apnea and treatment options.  6. Hypertension: Control with ACE inhibitors if able to have renal  protection  from diabetes mellitus.  7. Diabetes mellitus: Treatment with a goal hemoglobin A1c below 7.0.  8. Further diagnostic testing and treatment options after above.   ____________________________ Corey Skains, MD bjk:cbb D: 03/23/2012 18:14:25 ET T: 03/24/2012 10:30:03 ET JOB#: [id number]  cc: Corey Skains, MD, <Dictator> Corey Skains MD ELECTRONICALLY SIGNED 03/24/2012 13:18

## 2015-04-07 NOTE — Consult Note (Signed)
PATIENT NAME:  Caitlin Joseph, Caitlin Joseph MR#:  [id number] DATE OF BIRTH:  12/26/1945  DATE OF CONSULTATION:  03/24/2012  REFERRING PHYSICIAN:  Max Sane, MD  CONSULTING PHYSICIAN:  A. Lavone Orn, MD  CHIEF COMPLAINT: Hyperthyroidism.   HISTORY OF PRESENT ILLNESS: This is a 69 year old female seen in consultation for hyperthyroidism. She was admitted on 03/22/2012 when she presented with chest pain. She was found to have rapid atrial fibrillation which has improved with the addition of diltiazem and metoprolol. She was also found to have abnormal thyroid function tests with a low TSH on 03/23/2012 of 0.016 and a normal free T3 value of 3.9. She has no prior history of thyroid disease. She has no known family history of thyroid disease. She denies neck pain. She denies recent exposure to iodinated contrast dye. She has not had any exposure to amiodarone or lithium. She denies any neck pain. No recent fevers or URI. She has been feeling increasingly lethargic in recent weeks to the point where she has had difficulty ambulating. She reports onset of leg swelling and increased abdominal girth with an associated 35 pound weight gain in the last two months. She denies any recent change in diet and, in fact, has had a poor appetite. She does report heat intolerance ongoing for many months and thinning of the hair. She currently denies chest pain. She still has lethargy.   PAST MEDICAL HISTORY:  1. Type II diabetes mellitus.  2. History of acute myocardial infarction 14 years ago.  3. Morbid obesity.  4. Uterine fibroids.   PAST SURGICAL HISTORY:  1. Left ankle surgery.  2. Left knee surgery.  3. D and C.   SOCIAL HISTORY: She is married. She does not smoke cigarettes or drink alcohol.   FAMILY HISTORY: No known thyroid disease.   REVIEW OF SYSTEMS: GENERAL: She has had weight gain. She denies fevers. HEENT: She denies blurred vision. She denies sore throat. NECK: She denies any anterior neck pain although has  chronic posterior neck pain which often radiates. She denies dysphasia. No recent change in voice or hoarseness. CARDIAC: Chest pain as per history of present illness. She denies palpitations. PULMONARY: Some shortness of breath with exertion. No shortness of breath currently at rest. No cough. ABDOMEN: Decreased appetite, increased abdominal girth, feeling of early satiety. EXTREMITIES: She reports leg swelling. SKIN: She denies rash or pruritus. ENDOCRINE: She reports heat intolerance. No cold intolerance. HEME: She denies easy bruisability or recent bleeding. PSYCHIATRIC: Denies depression or recent change in mood.   PHYSICAL EXAMINATION:   VITAL SIGNS: Temperature 98.3, pulse 103, respirations 20, blood pressure 126/91.   GENERAL: Obese white female in no acute distress.   HEENT: Extraocular movements are intact. No proptosis. No lid lag. No stare. Oropharynx is clear. Mucous membranes moist.   NECK: Supple. Thyroid is not enlarged. There are no palpable thyroid nodules. Thyroid is smooth and without tenderness.   CARDIAC: Irregular heart rhythm with rapid rate.   PULMONARY: Clear to auscultation bilaterally. No wheeze.   ABDOMEN: Diffusely soft, nontender, nondistended.   EXTREMITIES: Trace bilateral leg edema is present.   SKIN: No rash or dermatopathy is present. Hair is coarse without any patches of hair loss.   NEUROLOGIC: No tremor is present. No focal neurologic deficit.   PSYCHIATRIC: Alert and oriented x3.   LABORATORY DATA: Thyroid labs as per history of present illness. No chemistries done today. On 03/23/2012, glucose 233, BUN 16, creatinine 0.79, sodium 141, potassium 4.1, chloride 107, eGFR over  60. Total cholesterol 110, triglycerides 53, LDL cholesterol 69. Hemoglobin A1c 8.2%. WBC 9.0, hematocrit 39.3. On 03/22/2012 AST 21, ALT 19, albumin 3.7, alkaline phosphatase 98.    ASSESSMENT: This is a 69 year old female with rapid atrial fibrillation and abnormal thyroid  function tests consistent with hyperthyroidism. Causes of hyperthyroidism may include Graves' disease versus a toxic adenoma or multinodular goiter.   PLAN:  1. Will obtain thyroid autoantibodies to evaluate for autoimmune thyroid disease or Graves' disease.  2. Will initiate methimazole 20 mg daily.  3. Should she remain in-house, I will repeat thyroid function tests in 3 to 4 days. If she is discharged prior to that, I will plan to see her in clinic in about four weeks for follow-up and adjust her medications at that time if needed.   I will follow along with you. Thank you for the kind request for consultation.   ____________________________ A. Lavone Orn, MD ams:drc D: 03/24/2012 12:33:39 ET T: 03/24/2012 12:52:48 ET JOB#: [id number]  cc: A. Lavone Orn, MD, <Dictator> Sherlon Handing MD ELECTRONICALLY SIGNED 03/27/2012 12:40

## 2015-04-07 NOTE — Consult Note (Signed)
PATIENT NAME:  Caitlin Joseph, Caitlin Joseph MR#:  [id number] DATE OF BIRTH:  [birth year]  DATE OF CONSULTATION:  03/22/2012  REFERRING PHYSICIAN:  Dr. Karsten Fells  CONSULTING PHYSICIAN:  Isaias Cowman, MD  CHIEF COMPLAINT: "My legs are swelling."  HISTORY OF PRESENT ILLNESS: Patient is a 69 year old female recently followed by Dr. Doreene Nest with diabetes admitted with multiple complaints including leg swelling, shortness of breath and back pain. Patient apparently has had a recent weight gain, primarily fluid retention with swelling in her legs that prompted her to present to [facility] Emergency Room where she was noted to be in atrial fibrillation with a rapid ventricular response. Patient was admitted to telemetry where she is ruling out for myocardial infarction by CPK isoenzymes and troponin. Patient was started on aspirin, furosemide, metoprolol and nitro patch. Patient remains in atrial fibrillation with a rate of 98 bpm. Patient also remains hypertensive with a blood pressure of 158/30.   PAST MEDICAL HISTORY:  1. Diabetes. 2. Questionable history of coronary artery disease. Patient reports she underwent cardiac catheterization in 2000 in [place] which did not reveal significant coronary artery disease.  3. Hypertension.   MEDICATIONS ON ADMISSION:  1. Novolin-N 30 to 35 sub-Q 3 to 4 times a day. 2. Humulin R 10 to 12 units 3 to 4 times a day.   SOCIAL HISTORY: Patient is married. She denies tobacco abuse.   FAMILY HISTORY: No immediate family history for coronary artery disease or myocardial infarction.    REVIEW OF SYSTEMS: CONSTITUTIONAL: No fever or chills. EYES: No blurry vision. EARS: No hearing loss. RESPIRATORY: Patient does have exertional dyspnea. CARDIOVASCULAR: Patient has intermittent mild chest discomfort. GASTROINTESTINAL: Patient denies nausea, vomiting, diarrhea, constipation. GENITOURINARY: Patient denies dysuria, hematuria. ENDOCRINE: Patient denies polyuria, polydipsia. MUSCULOSKELETAL: Patient  denies arthralgias, myalgias. NEUROLOGICAL: Patient denies any focal muscle weakness or numbness. PSYCHOLOGICAL: Patient denies anxiety or depression.   PHYSICAL EXAMINATION:  VITAL SIGNS: Blood pressure 158/130, pulse 98, respirations 18, temperature 98.3, pulse oximetry 98%.   HEENT: Pupils equal, reactive to light and accommodation.   NECK: Supple without thyromegaly.   LUNGS: Clear.   CARDIOVASCULAR: Normal jugular venous pressure. Normal point of maximal impulse. Regular, regular rhythm. Normal S1, S2. No appreciable gallop, murmur, rub.   ABDOMEN: Soft and nontender.   EXTREMITIES: There was 1 to 2+ bilateral pedal edema.   MUSCULOSKELETAL: Normal muscle tone.   NEUROLOGIC: Patient was alert and oriented x3. Motor and sensory both grossly intact.   IMPRESSION: 69 year old female who presents with diabetes, fluid retention, pedal edema, atrial fibrillation with rapid ventricular response. Patient noted to be hypertensive. Patient has a CHADS2 score of 2 and would merit chronic anticoagulation with warfarin.   RECOMMENDATIONS:  1. Agree with overall current therapy.  2. Up titrate metoprolol as needed to control ventricular rate and blood pressure.  3. Would proceed with chronic anticoagulation with warfarin. Monitor PT/INR with a target INR of 2 to 3.  4. Review 2-D echocardiogram.       5. Further recommendations pending patient's initial clinical course and echo results.  ____________________________ Isaias Cowman, MD ap:cms D: 03/22/2012 19:30:01 ET T: 03/23/2012 08:44:50 ET JOB#: [id number]  cc: Isaias Cowman, MD, <Dictator> Isaias Cowman MD ELECTRONICALLY SIGNED 03/30/2012 8:46

## 2015-04-07 NOTE — H&P (Signed)
PATIENT NAME:  Caitlin Joseph, Caitlin Joseph MR#:  [id number] DATE OF BIRTH:  [birth year]  DATE OF ADMISSION:  03/22/2012  REFERRING PHYSICIAN: ER physician, Dr. Thomasene Lot   PRIMARY CARE PHYSICIAN: The patient currently has no PCP. She was to follow-up with Dr. Audley Hose who has retired. She has not had any follow-up in more than a year.   CHIEF COMPLAINT: Multiple medical complaints of back pain, leg swelling, chest pain, shortness of breath, easy fatigability.   HISTORY OF PRESENT ILLNESS: The patient is a 69 year old female with past medical history of diabetes, possible history of MI more than 15 years ago. The patient reports not having any catheterization or stent placement. She just had an angiogram at that time. The patient's PCP, Dr. Audley Hose, retired more than a year ago and she has not had any follow-up with a primary care physician since then. She has multiple complaints including back pain. 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 She reports increasing leg swelling and weight gain of about 35 to 40 pounds or more in the last one month. She does report exertional retrosternal chest pain which radiates into her arms and jaw, relieved by taking a Bayer aspirin. The pain is described as heaviness. She also reports severe shortness of breath and easy fatigability with minimal exertion. As a result of these multiple problems, she is not able to do her usual activities and has gained a lot of weight in the last 6 to 8 months. In the ED, she was found to have atrial fibrillation with RVR. The duration of the atrial fibrillation is not known. The patient responded to IV Lopressor. She has multiple allergies to antihypertensive medications. She reports that with one of them she had facial swelling, possible angioedema, which could be enalapril. To the other antihypertensives, it does not appear that she had severe allergies. They just  made her feel bad.   PAST MEDICAL HISTORY: Diabetes.   ALLERGIES: Enalapril, HCTZ, Coreg, penicillin, amlodipine.   CURRENT MEDICATIONS:  1. Novolin N 30 to 35 subcutaneously 3 to 4 times a day.  2. Humulin R 10 to 12 units 3 to 4 times a day.   PAST MEDICAL HISTORY:  1. Diabetes.  2. 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 There is no history of catheterization or stent placement. 3. Fibroids ovarian cyst.  4. Fracture of coccyx x2.  PAST SURGICAL HISTORY:  1. The patient has had multiple fractures including left ankle, knee surgery. 2. D and C.   SOCIAL HISTORY: Denies any history of smoking, alcohol, or drug abuse. Her husband is a smoker and she is exposed to secondhand smoke. She is married and lives with her husband.   FAMILY HISTORY: Mother died of old age at the age of 74. Maternal grandmother and aunt had diabetes. Father died of metastatic pancreatic cancer. Two sisters have diabetes.   REVIEW OF SYSTEMS: CONSTITUTIONAL: The patient denies any fever. Reports fatigue, weakness, weight gain. EYES: Denies any blurred or double vision. ENT: Denies any tinnitus, ear pain. RESPIRATORY: Denies any cough, wheezing. CARDIOVASCULAR: Reports intermittent chest pain. Denies any palpitations or syncope. GI: Denies any nausea, vomiting, diarrhea. Reports constipation. GU: Denies any dysuria or hematuria. ENDOCRINE: Denies any polyuria or nocturia. HEME/LYMPH: Denies any anemia or easy bruisability. INTEGUMENTARY: Denies any acne or rash. MUSCULOSKELETAL: Reports swelling in her lower extremities, pain in multiple  joints including both hips, left ankle, and back. NEUROLOGICAL: Denies any vertigo or tremor. She reports neuropathic symptoms in her hands and feet. PSYCH: Denies any anxiety or depression.   PHYSICAL EXAMINATION:   VITAL SIGNS: Temperature 97, heart rate 115, respiratory rate 22, blood pressure 204/121, pulse oximetry 100% on room air.    GENERAL: The patient is an obese Caucasian female laying in bed not in acute distress.   HEAD: Atraumatic, normocephalic.   EYES: There is no pallor, icterus, or cyanosis. Pupils equal, round, and reactive to light and accommodation. Extraocular movements intact.   ENT: Wet mucous membranes. No oropharyngeal erythema or thrush.   NECK: Supple. No masses. No JVD. No thyromegaly or lymphadenopathy.   CHEST WALL: No tenderness to palpation. Not using accessory muscles of respiration. No intercostal muscle retractions.   LUNGS: Bilateral basal crepitations. No wheezing or rhonchi.   CARDIOVASCULAR: S1, S2 irregularly irregular. No murmur, rubs, or gallops.   ABDOMEN: Soft, obese, nontender, nondistended. Organomegaly was difficult to appreciate due to body habitus. Normal bowel sounds.   SKIN: No rashes or lesions.   PERIPHERIES: The patient has 2+ edema in her lower extremity extending up to her knees. There is 1+ pedal pulses.   MUSCULOSKELETAL: No cyanosis or clubbing. The patient reports tenderness to palpation on her lumbar spine and her left ankle.   NEUROLOGIC: Awake, alert, oriented x3. Nonfocal neurological exam. Cranial nerves grossly intact.   PSYCH: Normal mood and affect.   LABORATORY, DIAGNOSTIC, AND RADIOLOGICAL DATA: Urinalysis shows no evidence of infection. Cardiac enzymes normal. CBC normal. CMP normal other than potassium of 3.4 and glucose of 196. BNP 1041. Chest x-ray no acute changes.   ASSESSMENT AND PLAN: This is a 69 year old female with past medical history of diabetes who presents with multiple medical complaints.  1. Atrial fibrillation with RVR. The duration of the atrial fibrillation is not known. She has responded to IV Lopressor in the ED without any side effects. Therefore, we will switch her to p.o. Lopressor. Will start her on therapeutic dose Lovenox. She will likely need some form of long-term anticoagulation. Will check a TSH, echo, and Cardiology  consultation.  2. Accelerated hypertension. The patient's has accelerated hypertension with a blood pressure of 204/121. She has responded to IV Lopressor. Will continue p.o. Lopressor. Also start Lasix which will help with her lower extremity swelling and blood pressure. Will also start on p.r.n. hydralazine.  3. Diabetes. Will check a hemoglobin A1c. Continue Humulin N and place on a diabetic diet.  4. Hypokalemia. Will replace. 5. Elevated BNP. The patient's chest x-ray does show some cardiomegaly, however, there is no evidence of overt heart failure at present. Will check an echo to determine the patient's cardiac function. Will treat with IV Lasix.  6. Back pain. The patient reports severe back pain. She has been following up with a chiropractor who reported that the disks in her low back has one out. Will check a lumbar spine x-ray to rule out any other pathology.  7. Leg swelling. The patient has not been very mobile due to her multiple issues. Her legs have been more swollen in the last one month. Will check a Doppler to rule out any DVT. Will start on Lasix with potassium supplementation.  8. Chest pain. The patient reports exertional chest pain which radiates to her arm and jaw and is relieved by aspirin and rest. She also has shortness of breath and easy fatigability with minimal exertion. These could be anginal  equivalent. She has had an MI in the past. She has a risk factor of diabetes. Will check serial cardiac enzymes and obtain an inpatient stress test, echo, and Cardiology consultation. Will start her on aspirin and beta-blocker. It appears that she had angioedema to an ACE inhibitor, therefore, will hold off starting any ACE inhibitors for the time being. Will check a fasting lipid profile. 9. Constipation. Will start her on a bowel regimen.  10. Will place on GI prophylaxis.   Discussed with the ED physician, discussed with the patient and her husband the plan of care and management.    TIME SPENT: 75 minutes.   ____________________________ Cherre Huger, MD sp:drc D: 03/22/2012 14:26:00 ET T: 03/22/2012 14:57:09 ET JOB#: [id number]  cc: Cherre Huger, MD, <Dictator> Cherre Huger MD ELECTRONICALLY SIGNED 03/22/2012 20:57

## 2015-04-07 NOTE — Discharge Summary (Signed)
PATIENT NAME:  Caitlin, Joseph MR#:  [id number] DATE OF BIRTH:  1946-02-16  DATE OF ADMISSION:  05/22/2012 DATE OF DISCHARGE:  05/23/2012  DISCHARGE DIAGNOSES: 1. Atrial fibrillation.  2. Elevated troponin, most likely secondary to non-ST-elevation myocardial infarction. 3. Left-sided Bell's palsy.  4. Malignant hypertension.  5. Anxiety.  6. History of type 2 diabetes.  7. Obesity.   CHIEF COMPLAINT: Left facial numbness, left facial droop.   HISTORY OF PRESENT ILLNESS: Caitlin Joseph is a 69 year old female who has a history of atrial fibrillation, on Xarelto, also has history of congestive heart failure who presented to the ER complaining of left-sided facial droop associated with numbness and difficulty hearing in the left ear.  The patient thought she was having a stroke and was also noted to have an elevated blood pressure of 248/112 with a heart rate of 170. She subsequently became anxious and heart rate went up from 120 to 140's and she was given intravenous Cardizem 10 mg.   PAST MEDICAL HISTORY:  1. Atrial fibrillation, on anticoagulation with Xarelto. 2. History of osteoarthritis.  3. Neck pain. 4. Hypertension. 5. Morbid obesity.  6. History of type 2 diabetes.   CURRENT MEDICATIONS:  1. Humulin N 30 units three to four times a day with meals. 2. Humulin R 10 to 12 units three to four times a day. 3. Methimazole 20 mg p.o. daily.  4. Metoprolol 50 mg p.o. twice a day. 5. HCTZ 12.5 mg a day. 6. Klor-Con 20 milliequivalents daily. 7. Vicodin 5/500 mg 1 tablet p.o. twice a day. 8. Xarelto 20 mg a day.   PAST SURGICAL HISTORY:  1. Multiple fractures including left ankle. 2. Left knee surgery.  3. Dilation and curettage.   PHYSICAL EXAMINATION: GENERAL: She was awake and oriented x3, anxious. VITALS: Afebrile. Heart rate was 130 to 140. Respirations were 26, blood pressure 137/79, and oxygen saturation was 97% on 2 liters nasal cannula. HEENT: Normocephalic, atraumatic. Evidence  of Bell's palsy noted on the left side with facial droop. NECK: Supple. No JVD. HEART: S1 and S2 irregular. LUNGS: Clear to auscultation. ABDOMEN: Soft and nontender. EXTREMITIES: No edema.   LABS: Magnesium was 1.6. Initial CPK was 28 and MB 0.9. Troponin was less than 0.22. Repeat CPK was 78 and 126, respectively. MB went up to 11.7 and 17.7, respectively. Troponin also went up from 1.2 to 3.2, respectively. TSH was 1.81. Sugar was 209, BUN 16, creatinine 0.83, sodium 141, potassium 3.7, chloride 105, and CO2 27.   HOSPITAL COURSE: The patient was seen in consultation by Dr. Clayborn Bigness, cardiologist, and even after explaining that potentially she could have cardiac damage and coronary artery disease the patient refused further work-up and was insistent on going home. Dr. Clayborn Bigness had an extensive discussion with her and the patient elected not to have cardiac catheterization. She was discharged home on the following medications.   DISCHARGE MEDICATIONS:  1. Metoprolol 50 mg p.o. twice a day. 2. Methimazole 10 mg once a day.  3. Klor-Con 20 milliequivalents once a day. 4. Xarelto 20 mg once a day.  5. Vicodin 5/500 mg 1 tablet p.o. twice a day. 6. Diltiazem CD 120 mg once a day. 7. HCTZ 12.5 mg p.o. daily.     DISCHARGE FOLLOWUP: The patient has been advised to follow-up with me, Dr. Tracie Harrier, in 1 to 2 weeks, and also advised to followup with Dr. Saralyn Pilar, who his her cardiologist, in 2 to 4 weeks' time.  ____________________________ Tracie Harrier, MD vh:slb  D: 05/30/2012 18:33:17 ET T: 05/31/2012 10:40:49 ET JOB#: [id number]  cc: Tracie Harrier, MD, <Dictator> Tracie Harrier MD ELECTRONICALLY SIGNED 06/01/2012 12:53

## 2015-04-07 NOTE — Discharge Summary (Signed)
PATIENT NAME:  Caitlin Joseph, FILKINS MR#:  [id number] DATE OF BIRTH:  1946-07-31  DATE OF ADMISSION:  03/22/2012 DATE OF DISCHARGE:  03/25/2012  PRIMARY CARE PHYSICIAN: (New PCP - Tracie Harrier, MD)  ENDOCRINOLOGIST: Sherlon Handing, MD  CARDIOLOGIST: Isaias Cowman, MD  DISCHARGE DIAGNOSES:  1. Atrial fibrillation with rapid ventricular rate, could be due to hyperthyroidism. Rate is controlled with Lopressor and Cardizem, started on Xarelto now.  2. Hyperthyroidism, on Tapazole, now requires outpatient endocrine followup with Dr. Gabriel Carina.  3. Accelerated hypertension, blood pressure much better controlled now.  4. Acute diastolic heart failure with echocardiogram showing normal LV function, on Lasix.  5. Diabetes, on insulin.  6. Hypokalemia, repleted and resolved.  7. Leg swelling likely due to underlying heart failure. No deep vein thrombosis.  8. Chest pain, now resolved. Could not do stress test, on aspirin and beta blocker. Cannot start ACE inhibitor due to history of angioedema, due to same.   SECONDARY DIAGNOSIS: Diabetes.   CONSULTANTS: 1. Isaias Cowman, MD - Cardiology. 2. Physical Therapy.  3. Lavone Orn, MD - Endocrinology.  PROCEDURES/RADIOLOGY: Chest x-ray on 03/22/2012 showed no acute cardiopulmonary disease.   Left hip x-ray on 03/22/2012 showed no acute osseous injury of the left hip.   Lumbar spine x-ray on 03/22/2012 showed no acute osseous abnormality of the lumbar spine.   Bilateral lower extremity Dopplers on 03/22/2012 showed no deep vein thrombosis.   Myoview on 03/23/2012 was incomplete as the patient refused to be imaged.   2D echocardiogram on 03/23/2012 showed normal LV systolic function with ejection fraction of 55%, moderate concentric left ventricular hypertrophy, mildly dilated left atrium and right atrium, moderate mitral regurgitation and tricuspid regurgitation.   LABS/STUDIES: Urinalysis on 03/22/2012 was negative.   Serum free T3 was  within normal limits. TSH was low with a value of 0.016 and free T4 was elevated with a value of 1.52.  HISTORY AND SHORT HOSPITAL COURSE: The patient is a 69 year old female with known history of diabetes who was admitted for rapid atrial fibrillation and was started on therapeutic dose of Lovenox for anticoagulation along with Lopressor for rate control. Her blood pressure on admission was 204/121. She was also started on Lasix for possible heart failure as she was having lower extremity edema. Please see Dr. Dewitt Rota Panwar's dictated history and physical for further details. Cardiology consultation was obtained with Dr. Saralyn Pilar who recommended 2-D echocardiogram, which was performed with results dictated above showing normal LV function. He agreed with the above management for rate control and consider anticoagulation with Xarelto versus Coumadin. The patient was found to have low TSH. Subsequently, free T4 and free T3 was checked and was found to have elevated free T4 and was thought to have possible hyperthyroidism, which also seemed to be contributing to her atrial fibrillation. Endocrine consult was obtained with Dr. Gabriel Carina who recommended starting Tapazole. The patient started feeling better while on Tapazole. Her lower extremity swelling was slowly getting better along with her shortness of breath, while on Lasix. On 03/25/2012, she was feeling significantly better and was evaluated by physical therapy and was recommended home with home health along with nursing and physical therapy. On 02/23/2012 she was discharged home in stable condition. Her blood pressure was also improved. On the date of discharge her vital signs were as follows: Temperature 98.3, heart rate 83 per minute, respirations 18 per minute, blood pressure 128/79 mmHg, and she was saturating 97% on room air.   PERTINENT PHYSICAL EXAMINATION:   CARDIOVASCULAR:  Irregular, irregular heart rate. No murmurs, rubs, or gallops.   LUNGS:  Clear to auscultation bilaterally. No wheezing, rales, rhonchi, or crepitation.   ABDOMEN: Soft, benign.   NEUROLOGIC: Nonfocal examination. All other physical examination remained at baseline.   DISCHARGE MEDICATIONS:  1. Humulin N 30 to 35 units subcutaneous 3 to 4 times a day with meals. 2. Humulin R 10 to 12 units injectable 3 to 4 times a day as she had been taking at home.  3. Methimazole 20 mg p.o. daily 4. Aspirin 81 mg p.o. daily.  5. Lasix 20 mg p.o. twice a day.  6. Metoprolol 50 mg p.o. twice a day. 7. Cardizem CD 120 mg p.o. daily.  8. Xarelto 20 mg p.o. daily.  9. K-Dur 20 milliequivalents p.o. daily.   DISCHARGE DIET: Low sodium, 1800 ADA, low cholesterol.   DISCHARGE ACTIVITY: As tolerated.   DISCHARGE INSTRUCTIONS AND FOLLOW-UP: The patient was instructed to follow-up with her new primary care physician, Dr. Tracie Harrier, at [place], in 1 to 2 weeks. She will need follow-up with Dr. Gabriel Carina in 2 to 4 weeks and with Dr. Saralyn Pilar from cardiology in 2 to 3 weeks. She was set up to get home health with physical therapy, nursing and nursing aid. The patient may need adjustment in her insulin regimen which can be done by her primary care physician or endocrinologist.  TOTAL TIME DISCHARGING THIS PATIENT: 55 minutes. ____________________________ [name]. Manuella Ghazi, MD vss:slb D: 03/26/2012 11:20:38 ET     T: 03/28/2012 12:37:44 ET        JOB#: [id number] cc: Starlit Raburn S. Manuella Ghazi, MD, <Dictator> Tracie Harrier, MD A. Lavone Orn, MD Isaias Cowman, MD Remer Macho MD ELECTRONICALLY SIGNED 03/28/2012 22:16

## 2015-06-28 ENCOUNTER — Ambulatory Visit: Payer: Self-pay | Admitting: Urology

## 2015-07-23 ENCOUNTER — Ambulatory Visit: Payer: Self-pay | Admitting: Urology

## 2015-08-22 ENCOUNTER — Encounter: Payer: Self-pay | Admitting: *Deleted

## 2015-08-28 ENCOUNTER — Encounter: Payer: Self-pay | Admitting: *Deleted

## 2015-08-28 ENCOUNTER — Encounter
Admission: RE | Disposition: A | Discharge status: Home / Self Care | Payer: Self-pay | Source: Ambulatory Visit | Attending: Ophthalmology

## 2015-08-28 ENCOUNTER — Ambulatory Visit: Payer: Commercial Managed Care - HMO | Admitting: Certified Registered"

## 2015-08-28 ENCOUNTER — Ambulatory Visit
Admission: RE | Admit: 2015-08-28 | Discharge: 2015-08-28 | Disposition: A | Discharge status: Home / Self Care | Payer: Commercial Managed Care - HMO | Source: Ambulatory Visit | Attending: Ophthalmology | Admitting: Ophthalmology

## 2015-08-28 DIAGNOSIS — Z79899 Other long term (current) drug therapy: Secondary | ICD-10-CM | POA: Diagnosis not present

## 2015-08-28 DIAGNOSIS — E039 Hypothyroidism, unspecified: Secondary | ICD-10-CM | POA: Diagnosis not present

## 2015-08-28 DIAGNOSIS — Z6841 Body Mass Index (BMI) 40.0 and over, adult: Secondary | ICD-10-CM | POA: Diagnosis not present

## 2015-08-28 DIAGNOSIS — H4311 Vitreous hemorrhage, right eye: Secondary | ICD-10-CM | POA: Diagnosis not present

## 2015-08-28 DIAGNOSIS — Z880 Allergy status to penicillin: Secondary | ICD-10-CM | POA: Insufficient documentation

## 2015-08-28 DIAGNOSIS — I2510 Atherosclerotic heart disease of native coronary artery without angina pectoris: Secondary | ICD-10-CM | POA: Diagnosis not present

## 2015-08-28 DIAGNOSIS — E11359 Type 2 diabetes mellitus with proliferative diabetic retinopathy without macular edema: Secondary | ICD-10-CM | POA: Diagnosis not present

## 2015-08-28 DIAGNOSIS — G709 Myoneural disorder, unspecified: Secondary | ICD-10-CM | POA: Diagnosis not present

## 2015-08-28 DIAGNOSIS — Z91013 Allergy to seafood: Secondary | ICD-10-CM | POA: Diagnosis not present

## 2015-08-28 DIAGNOSIS — G43909 Migraine, unspecified, not intractable, without status migrainosus: Secondary | ICD-10-CM | POA: Diagnosis not present

## 2015-08-28 DIAGNOSIS — Z881 Allergy status to other antibiotic agents status: Secondary | ICD-10-CM | POA: Diagnosis not present

## 2015-08-28 DIAGNOSIS — M2550 Pain in unspecified joint: Secondary | ICD-10-CM | POA: Diagnosis not present

## 2015-08-28 DIAGNOSIS — E0520 Thyrotoxicosis with toxic multinodular goiter without thyrotoxic crisis or storm: Secondary | ICD-10-CM | POA: Diagnosis not present

## 2015-08-28 DIAGNOSIS — I10 Essential (primary) hypertension: Secondary | ICD-10-CM | POA: Insufficient documentation

## 2015-08-28 DIAGNOSIS — I480 Paroxysmal atrial fibrillation: Secondary | ICD-10-CM | POA: Insufficient documentation

## 2015-08-28 DIAGNOSIS — F419 Anxiety disorder, unspecified: Secondary | ICD-10-CM | POA: Diagnosis not present

## 2015-08-28 DIAGNOSIS — F329 Major depressive disorder, single episode, unspecified: Secondary | ICD-10-CM | POA: Insufficient documentation

## 2015-08-28 DIAGNOSIS — I252 Old myocardial infarction: Secondary | ICD-10-CM | POA: Diagnosis not present

## 2015-08-28 DIAGNOSIS — M1990 Unspecified osteoarthritis, unspecified site: Secondary | ICD-10-CM | POA: Diagnosis not present

## 2015-08-28 DIAGNOSIS — M791 Myalgia: Secondary | ICD-10-CM | POA: Diagnosis not present

## 2015-08-28 HISTORY — DX: Hypothyroidism, unspecified: E03.9

## 2015-08-28 HISTORY — DX: Edema, unspecified: R60.9

## 2015-08-28 HISTORY — DX: Type 2 diabetes mellitus without complications: E11.9

## 2015-08-28 HISTORY — PX: PARS PLANA VITRECTOMY: SHX2166

## 2015-08-28 HISTORY — DX: Reserved for inherently not codable concepts without codable children: IMO0001

## 2015-08-28 HISTORY — DX: Anxiety disorder, unspecified: F41.9

## 2015-08-28 HISTORY — DX: Headache, unspecified: R51.9

## 2015-08-28 HISTORY — DX: Headache: R51

## 2015-08-28 HISTORY — DX: Unspecified osteoarthritis, unspecified site: M19.90

## 2015-08-28 HISTORY — DX: Cardiac arrhythmia, unspecified: I49.9

## 2015-08-28 HISTORY — DX: Syncope and collapse: R55

## 2015-08-28 LAB — GLUCOSE, CAPILLARY
GLUCOSE-CAPILLARY: 172 mg/dL — AB (ref 65–99)
GLUCOSE-CAPILLARY: 180 mg/dL — AB (ref 65–99)
Glucose-Capillary: 180 mg/dL — ABNORMAL HIGH (ref 65–99)

## 2015-08-28 SURGERY — PARS PLANA VITRECTOMY WITH 25 GAUGE
Anesthesia: Monitor Anesthesia Care | Site: Eye | Laterality: Right | Wound class: Clean

## 2015-08-28 MED ORDER — ATROPINE SULFATE 1 % OP SOLN
OPHTHALMIC | Status: AC
  Filled 2015-08-28: qty 5

## 2015-08-28 MED ORDER — PHENYLEPHRINE HCL 10 % OP SOLN
OPHTHALMIC | Status: AC
  Administered 2015-08-28: 1 [drp] via OPHTHALMIC
  Filled 2015-08-28: qty 5

## 2015-08-28 MED ORDER — SODIUM CHLORIDE 0.9 % IV SOLN
INTRAVENOUS | Status: DC
  Administered 2015-08-28 (×2): via INTRAVENOUS

## 2015-08-28 MED ORDER — MIDAZOLAM HCL 2 MG/2ML IJ SOLN
INTRAMUSCULAR | Status: DC | PRN
  Administered 2015-08-28: 1 mg via INTRAVENOUS

## 2015-08-28 MED ORDER — NEOMYCIN-POLYMYXIN-DEXAMETH 3.5-10000-0.1 OP OINT
TOPICAL_OINTMENT | OPHTHALMIC | Status: AC
  Filled 2015-08-28: qty 3.5

## 2015-08-28 MED ORDER — CYCLOPENTOLATE HCL 2 % OP SOLN
OPHTHALMIC | Status: AC
  Administered 2015-08-28: 1 [drp] via OPHTHALMIC
  Filled 2015-08-28: qty 2

## 2015-08-28 MED ORDER — DEXAMETHASONE SODIUM PHOSPHATE 10 MG/ML IJ SOLN
INTRAMUSCULAR | Status: AC
  Filled 2015-08-28: qty 1

## 2015-08-28 MED ORDER — CYCLOPENTOLATE HCL 2 % OP SOLN
1.0000 [drp] | Freq: Once | OPHTHALMIC | Status: AC
  Administered 2015-08-28: 1 [drp] via OPHTHALMIC

## 2015-08-28 MED ORDER — LIDOCAINE HCL (PF) 4 % IJ SOLN
INTRAMUSCULAR | Status: DC | PRN
  Administered 2015-08-28: 10 mL via OPHTHALMIC

## 2015-08-28 MED ORDER — LIDOCAINE HCL (PF) 4 % IJ SOLN
INTRAMUSCULAR | Status: AC
  Filled 2015-08-28: qty 5

## 2015-08-28 MED ORDER — METOPROLOL TARTRATE 1 MG/ML IV SOLN
INTRAVENOUS | Status: DC | PRN
  Administered 2015-08-28: 2.5 mg via INTRAVENOUS

## 2015-08-28 MED ORDER — TETRACAINE HCL 0.5 % OP SOLN
OPHTHALMIC | Status: AC
  Filled 2015-08-28: qty 2

## 2015-08-28 MED ORDER — ATROPINE SULFATE 1 % OP SOLN
OPHTHALMIC | Status: DC | PRN
  Administered 2015-08-28: 2 [drp] via OPHTHALMIC

## 2015-08-28 MED ORDER — ALFENTANIL 500 MCG/ML IJ INJ
INJECTION | INTRAMUSCULAR | Status: DC | PRN
  Administered 2015-08-28: 500 ug via INTRAVENOUS

## 2015-08-28 MED ORDER — BSS PLUS IO SOLN
INTRAOCULAR | Status: DC | PRN
  Administered 2015-08-28: 1 via INTRAOCULAR

## 2015-08-28 MED ORDER — DEXAMETHASONE SODIUM PHOSPHATE 10 MG/ML IJ SOLN
INTRAMUSCULAR | Status: DC | PRN
  Administered 2015-08-28: 10 mg

## 2015-08-28 MED ORDER — BUPIVACAINE HCL (PF) 0.75 % IJ SOLN
INTRAMUSCULAR | Status: AC
  Filled 2015-08-28: qty 10

## 2015-08-28 MED ORDER — HYALURONIDASE HUMAN 150 UNIT/ML IJ SOLN
INTRAMUSCULAR | Status: AC
  Filled 2015-08-28: qty 1

## 2015-08-28 MED ORDER — TETRACAINE HCL 0.5 % OP SOLN
OPHTHALMIC | Status: DC | PRN
  Administered 2015-08-28: 2 [drp] via OPHTHALMIC

## 2015-08-28 MED ORDER — PHENYLEPHRINE HCL 10 % OP SOLN
1.0000 [drp] | OPHTHALMIC | Status: AC | PRN
  Administered 2015-08-28 (×3): 1 [drp] via OPHTHALMIC

## 2015-08-28 MED ORDER — CYCLOPENTOLATE HCL 2 % OP SOLN
1.0000 [drp] | OPHTHALMIC | Status: AC | PRN
  Administered 2015-08-28 (×3): 1 [drp] via OPHTHALMIC

## 2015-08-28 MED ORDER — PHENYLEPHRINE HCL 10 % OP SOLN
1.0000 [drp] | Freq: Once | OPHTHALMIC | Status: AC
  Administered 2015-08-28: 1 [drp] via OPHTHALMIC

## 2015-08-28 MED ORDER — NEOMYCIN-POLYMYXIN-DEXAMETH 0.1 % OP OINT
TOPICAL_OINTMENT | OPHTHALMIC | Status: DC | PRN
  Administered 2015-08-28: 1 via OPHTHALMIC

## 2015-08-28 MED ORDER — FENTANYL CITRATE (PF) 100 MCG/2ML IJ SOLN
INTRAMUSCULAR | Status: DC | PRN
  Administered 2015-08-28 (×2): 50 ug via INTRAVENOUS

## 2015-08-28 MED ORDER — BSS PLUS IO SOLN
Freq: Once | INTRAOCULAR | Status: DC
  Filled 2015-08-28: qty 500

## 2015-08-28 SURGICAL SUPPLY — 27 items
APPLICATOR COTTON TIP 6IN STRL (MISCELLANEOUS) ×12 IMPLANT
CANNULA SOFT TIP 25G (CANNULA) IMPLANT
CORD BIP STRL DISP 12FT (MISCELLANEOUS) IMPLANT
CUP MEDICINE 2OZ PLAST GRAD ST (MISCELLANEOUS) ×6 IMPLANT
ERASER HMR WETFIELD 25G (MISCELLANEOUS) IMPLANT
FORCEPS GRIESH GRASP 25G (INSTRUMENTS) IMPLANT
FORCEPS GRIESH ILM PLUS 25G (INSTRUMENTS) IMPLANT
GLOVE BIO SURGEON STRL SZ8 (GLOVE) ×3 IMPLANT
GLOVE SURG LX 6.5 MICRO (GLOVE) ×2
GLOVE SURG LX STRL 6.5 MICRO (GLOVE) ×1 IMPLANT
GOWN STRL REUS W/ TWL LRG LVL3 (GOWN DISPOSABLE) ×2 IMPLANT
GOWN STRL REUS W/TWL LRG LVL3 (GOWN DISPOSABLE) ×4
LENS BIOM OPTIC SET 200MM DISP (MISCELLANEOUS) ×3 IMPLANT
LENS VITRECTOMY FLAT DISP (MISCELLANEOUS) IMPLANT
NDL RETROBULBAR .5 NSTRL (NEEDLE) ×3 IMPLANT
NEEDLE FILTER BLUNT 18X 1/2SAF (NEEDLE) ×2
NEEDLE FILTER BLUNT 18X1 1/2 (NEEDLE) ×1 IMPLANT
PACK EYE AFTER SURG (MISCELLANEOUS) ×3 IMPLANT
PACK VITRECTOMY (MISCELLANEOUS) ×3 IMPLANT
PACK VITRECTOMY CASSETTE 25GA (MISCELLANEOUS) ×3 IMPLANT
PROBE DIRECTIONAL LASER (MISCELLANEOUS) IMPLANT
PROBE LASER ILLUM FLEX CVD 25G (OPHTHALMIC) ×3 IMPLANT
SOL PREP PVP 2OZ (MISCELLANEOUS) ×3
SOLUTION PREP PVP 2OZ (MISCELLANEOUS) ×1 IMPLANT
STRAP SAFETY BODY (MISCELLANEOUS) ×3 IMPLANT
SUT VICRYL 7 0 TG140 8 (SUTURE) ×3 IMPLANT
SYRINGE 10CC LL (SYRINGE) ×3 IMPLANT

## 2015-08-28 NOTE — OR Nursing (Signed)
Pt states she is allergic to some blood pressure meds but does not know the name.

## 2015-08-28 NOTE — Transfer of Care (Signed)
Immediate Anesthesia Transfer of Care Note  Patient: Caitlin Joseph  Procedure(s) Performed: Procedure(s) with comments: PARS PLANA VITRECTOMY WITH 25 GAUGE  (Right) - Lot # [id number]  Patient Location: PACU  Anesthesia Type:MAC  Level of Consciousness: awake  Airway & Oxygen Therapy: Patient Spontanous Breathing and Patient connected to nasal cannula oxygen  Post-op Assessment: Report given to RN  Post vital signs: Reviewed  Last Vitals:  Filed Vitals:   08/28/15 1101  BP: 160/93  Pulse: 96  Temp: 36.1 C  Resp: 12    Complications: No apparent anesthesia complications

## 2015-08-28 NOTE — Discharge Instructions (Signed)

## 2015-08-28 NOTE — H&P (Signed)
Patient admitted to eating some gummy foods around 4am.  Discussed at length with patient and anesthesia, and we decided to push her surgery back to six hours post ingestion per anesthesia recommendations.    Otherwise Previous H&P scanned in reviewed, patient examined, and no interval changes.  Please see scanned record for complete information.

## 2015-08-28 NOTE — OR Nursing (Signed)
Pt stated she is feeling bad wanted blood sugar checked - 172

## 2015-08-28 NOTE — Op Note (Signed)
..  DATE: 05/29/15 INDICATIONS & JUSTIFICATIONS FOR SURGERY: The patient was examined in the clinic for a nonclearing vitreous hemorrhage in the right eye.The risks, benefits, alternatives, and complications were discussed with the patient, and she elected to proceed with pars plana vitrectomy.    PREOPERATIVE DIAGNOSIS: Vitreous hemorrhage, right eye, Proliferative diabetic retinopathy.            POST OPERATIVE DIAGNOSIS: Vitreous hemorrhage, right eye, Proliferative diabetic retinopathy. OPERATION PERFORMED: 25-gauge pars plana vitrectomy, endolaser, air-fluid exchange, air, right eye.                    ANESTHESIA: MAC with retrobulbar block.   COMPLICATIONS: None.     BLOOD LOSS: Minimal.   SPECIMEN: None.   DESCRIPTION OF PROCEDURE: On the day of surgery, the patient was greeted in the preoperative holding area.  All questions were answered. The right eye was marked. The patient was then taken into the operating room in the supine position.   Next, 5 ml of a retrobulbar block consisting of 4% xylocaine plain, 0.75% sensorcaine plain and hylenex 100u was injected into the right orbit.The right eye was then prepped and draped in the usual sterile fashion.  Next, 25-gauge trocars were placed in the usual position. The infusion cannula was checked to make sure it was in the vitreous cavity despite the blood in the vitreous.  A core vitrectomy was performed and once the retina was visible the  [facility] in the yaloid was then utilized to remove the vitreous to the periphery. There were no active areas of bleeding or neovascularization, however, small plaques along the vessels were noted during removal of the vitreous. Three hundred and sixty degrees of pan retinal photocoagulation was performed. The periphery was carefully inspected for 360 degrees, there were no retinal tears or breaks noted. The trocars were then removed and sutured with 7.0 vicryl.  Subconjunctival cefuroxime, dexamethasone, and triamcinolone  were injected.  The eye was then patched and shielded with atropine and neo-poly-dex.  The patient was taken to the recovery area in stable condition.

## 2015-08-28 NOTE — Anesthesia Preprocedure Evaluation (Signed)
Anesthesia Evaluation  Patient identified by MRN, date of birth, ID band Patient awake    Reviewed: Allergy & Precautions, H&P , NPO status , Patient's Chart, lab work & pertinent test results, reviewed documented beta blocker date and time   History of Anesthesia Complications Negative for: history of anesthetic complications  Airway Mallampati: II  TM Distance: >3 FB Neck ROM: full    Dental  (+) Poor Dentition, Missing, Chipped   Pulmonary neg pulmonary ROS,    Pulmonary exam normal breath sounds clear to auscultation       Cardiovascular Exercise Tolerance: Good hypertension, On Medications (-) angina+ CAD and + Past MI  (-) Cardiac Stents and (-) CABG Normal cardiovascular exam+ dysrhythmias (resolved since treatment of hyperthyroid) Atrial Fibrillation  Rhythm:regular Rate:Normal     Neuro/Psych Seizures - (one seizure episode, reaction to medication, none since),  PSYCHIATRIC DISORDERS (depression and anxiety)  Neuromuscular disease (dermatomyositis)    GI/Hepatic negative GI ROS, Neg liver ROS,   Endo/Other  diabetes, Insulin DependentHyperthyroidism Morbid obesity  Renal/GU negative Renal ROS  negative genitourinary   Musculoskeletal   Abdominal   Peds  Hematology negative hematology ROS (+)   Anesthesia Other Findings Past Medical History:   Hypertension                                                 Toxic multinodular goiter                                    Low vitamin B12 level                                        Urinary incontinence                                         DM (dermatomyositis)                                         Morbid obesity                                               Fibroid, uterine                                             Leg edema                                                    AMI (acute myocardial infarction)  CAD (coronary artery  disease)                                Chronic depressive disorder                                  Hyperthyroidism                                              Paroxysmal a-fib                                             Myalgia                                                      Arthralgia                                                   Shortness of breath dyspnea                                  Dysrhythmia                                                    Comment:afib   Syncope                                                        Comment:due to medication   Headache                                                     Diabetes mellitus without complication                       Hypothyroidism                                               Anxiety  Arthritis                                                    Edema                                                          Comment:feet/legs   Reproductive/Obstetrics negative OB ROS                             Anesthesia Physical Anesthesia Plan  ASA: III  Anesthesia Plan: MAC   Post-op Pain Management:    Induction:   Airway Management Planned:   Additional Equipment:   Intra-op Plan:   Post-operative Plan:   Informed Consent: I have reviewed the patients History and Physical, chart, labs and discussed the procedure including the risks, benefits and alternatives for the proposed anesthesia with the patient or authorized representative who has indicated his/her understanding and acceptance.   Dental Advisory Given  Plan Discussed with: Anesthesiologist, CRNA and Surgeon  Anesthesia Plan Comments:         Anesthesia Quick Evaluation

## 2015-08-29 MED FILL — Polyethylene Glycol-Propylene Glycol Ophth Gel 0.4-0.3%: OPHTHALMIC | Qty: 15 | Status: AC

## 2015-08-29 NOTE — Anesthesia Postprocedure Evaluation (Signed)
  Anesthesia Post-op Note  Patient: Caitlin Joseph  Procedure(s) Performed: Procedure(s) with comments: PARS PLANA VITRECTOMY WITH 25 GAUGE  (Right) - Lot # [id number]  Anesthesia type:MAC  Patient location: PACU  Post pain: Pain level controlled  Post assessment: Post-op Vital signs reviewed, Patient's Cardiovascular Status Stable, Respiratory Function Stable, Patent Airway and No signs of Nausea or vomiting  Post vital signs: Reviewed and stable  Last Vitals:  Filed Vitals:   08/28/15 1142  BP: 172/90  Pulse: 92  Temp:   Resp: 16    Level of consciousness: awake, alert  and patient cooperative  Complications: No apparent anesthesia complications
